# Patient Record
Sex: Female | Born: 1955 | Hispanic: No | Marital: Married | Smoking: Never smoker
Health system: Southern US, Community
[De-identification: ages and names within clinical notes are randomized; demographics above are authoritative.]

## PROBLEM LIST (undated history)

## (undated) DIAGNOSIS — F419 Anxiety disorder, unspecified: Secondary | ICD-10-CM

## (undated) DIAGNOSIS — F329 Major depressive disorder, single episode, unspecified: Secondary | ICD-10-CM

## (undated) DIAGNOSIS — Z9989 Dependence on other enabling machines and devices: Secondary | ICD-10-CM

## (undated) DIAGNOSIS — G4730 Sleep apnea, unspecified: Secondary | ICD-10-CM

## (undated) DIAGNOSIS — E079 Disorder of thyroid, unspecified: Secondary | ICD-10-CM

## (undated) DIAGNOSIS — E785 Hyperlipidemia, unspecified: Secondary | ICD-10-CM

## (undated) DIAGNOSIS — E039 Hypothyroidism, unspecified: Secondary | ICD-10-CM

## (undated) DIAGNOSIS — F32A Depression, unspecified: Secondary | ICD-10-CM

## (undated) DIAGNOSIS — I10 Essential (primary) hypertension: Secondary | ICD-10-CM

## (undated) DIAGNOSIS — K5792 Diverticulitis of intestine, part unspecified, without perforation or abscess without bleeding: Secondary | ICD-10-CM

## (undated) HISTORY — DX: Essential (primary) hypertension: I10

## (undated) HISTORY — DX: Hypothyroidism, unspecified: E03.9

## (undated) HISTORY — DX: Diverticulitis of intestine, part unspecified, without perforation or abscess without bleeding: K57.92

## (undated) HISTORY — DX: Hyperlipidemia, unspecified: E78.5

## (undated) HISTORY — DX: Anxiety disorder, unspecified: F41.9

## (undated) HISTORY — DX: Disorder of thyroid, unspecified: E07.9

## (undated) HISTORY — DX: Depression, unspecified: F32.A

## (undated) HISTORY — DX: Sleep apnea, unspecified: G47.30

## (undated) HISTORY — DX: Dependence on other enabling machines and devices: Z99.89

## (undated) HISTORY — PX: ABDOMINAL HYSTERECTOMY: SHX81

## (undated) HISTORY — PX: CHOLECYSTECTOMY: SHX55

## (undated) HISTORY — PX: BREAST EXCISIONAL BIOPSY: SUR124

## (undated) HISTORY — PX: COLONOSCOPY: SHX174

## (undated) HISTORY — DX: Major depressive disorder, single episode, unspecified: F32.9

---

## 2017-04-29 ENCOUNTER — Other Ambulatory Visit: Payer: Self-pay | Admitting: Internal Medicine

## 2017-04-29 DIAGNOSIS — Z1231 Encounter for screening mammogram for malignant neoplasm of breast: Secondary | ICD-10-CM

## 2017-04-30 ENCOUNTER — Ambulatory Visit
Admission: RE | Admit: 2017-04-30 | Discharge: 2017-04-30 | Disposition: A | Discharge status: Home / Self Care | Payer: BLUE CROSS/BLUE SHIELD | Source: Ambulatory Visit | Attending: Internal Medicine | Admitting: Internal Medicine

## 2017-04-30 DIAGNOSIS — Z1231 Encounter for screening mammogram for malignant neoplasm of breast: Secondary | ICD-10-CM

## 2018-01-04 ENCOUNTER — Telehealth: Payer: Self-pay | Admitting: Internal Medicine

## 2018-01-04 NOTE — Telephone Encounter (Signed)
Yes, I will accept her 

## 2018-01-04 NOTE — Telephone Encounter (Signed)
Copied from CRM [id number]. Topic: Appointment Scheduling - New Patient >> Jan 04, 2018  3:31 PM Baldo DaubAlexander, Amber L wrote: Pt calling.  States that she would like to personally ask if Dr. Lawerance BachBurns would see her for primary care.  Pt states her son is Dr. Marjory LiesPenumalli, neurologist [facility] and he recommended that his mother see Dr. Lawerance BachBurns. Pt can be reached at [phone]

## 2018-01-05 NOTE — Progress Notes (Signed)
Subjective:    Patient ID: Alicia Hunt, female    DOB: 27-Nov-1955, 62 y.o.   MRN: [id number]  HPI  She is here to establish with a new pcp.  She is here for a physical exam.    She has no concerns.  She knows she needs to lose weight.  She did the keto diet last and lost 15 pounds.  Over the past year she gained back and gained additional weight.  She does eat a lot of carbohydrates.  She is currently exercising regularly-does water aerobics 4 times a week.  She does have some generalized aches and pains.  She thinks part of that is getting older and thinks the weight probably contributes as well.      Medications and allergies reviewed with patient and updated if appropriate.  Patient Active Problem List   Diagnosis Date Noted  . Hypertension 01/06/2018  . Hypothyroidism 01/06/2018  . Hyperglycemia 01/06/2018  . Vitamin D deficiency 01/06/2018  . Low vitamin B12 level 01/06/2018  . Depression 01/06/2018  . Other hyperlipidemia 01/06/2018  . H/O Diverticulitis of colon with bleeding 01/06/2018    Current Outpatient Medications on File Prior to Visit  Medication Sig Dispense Refill  . amLODipine (NORVASC) 5 MG tablet Take 5 mg by mouth daily.    Marland Kitchen. atorvastatin (LIPITOR) 10 MG tablet Take 10 mg by mouth daily.    Marland Kitchen. escitalopram (LEXAPRO) 10 MG tablet Take 10 mg by mouth daily.    . Levothyroxine Sodium (SYNTHROID PO) Take by mouth.    . metoprolol tartrate (LOPRESSOR) 25 MG tablet Take 25 mg by mouth 2 (two) times daily.     No current facility-administered medications on file prior to visit.     Past Medical History:  Diagnosis Date  . Anxiety   . Depression   . Diverticulitis   . Hyperlipidemia   . Hypertension   . Thyroid disease     Past Surgical History:  Procedure Laterality Date  . ABDOMINAL HYSTERECTOMY    . BREAST EXCISIONAL BIOPSY Left 20+ years   pt states dr didnt remove lump  . CHOLECYSTECTOMY      Social History   Socioeconomic  History  . Marital status: Married    Spouse name: Not on file  . Number of children: Not on file  . Years of education: Not on file  . Highest education level: Not on file  Occupational History  . Not on file  Social Needs  . Financial resource strain: Not on file  . Food insecurity:    Worry: Not on file    Inability: Not on file  . Transportation needs:    Medical: Not on file    Non-medical: Not on file  Tobacco Use  . Smoking status: Never Smoker  . Smokeless tobacco: Never Used  Substance and Sexual Activity  . Alcohol use: Yes  . Drug use: Never  . Sexual activity: Not on file  Lifestyle  . Physical activity:    Days per week: Not on file    Minutes per session: Not on file  . Stress: Not on file  Relationships  . Social connections:    Talks on phone: Not on file    Gets together: Not on file    Attends religious service: Not on file    Active member of club or organization: Not on file    Attends meetings of clubs or organizations: Not on file    Relationship status: Not  on file  Other Topics Concern  . Not on file  Social History Narrative  . Not on file    Family History  Problem Relation Age of Onset  . Stroke Mother 960       cause of death    Review of Systems  Constitutional: Negative for chills, fatigue and fever.  Eyes: Negative for visual disturbance.  Respiratory: Positive for shortness of breath (sometimes with exertion - humidity). Negative for cough and wheezing.   Cardiovascular: Positive for leg swelling (with travel and heat). Negative for chest pain and palpitations.  Gastrointestinal: Positive for abdominal pain. Negative for blood in stool, constipation, diarrhea and nausea.       No gerd  Genitourinary: Negative for dysuria and hematuria.  Musculoskeletal: Positive for arthralgias (knees sometimes, achy joints).  Skin: Negative for color change and rash.  Neurological: Positive for numbness (numb/tingling toes) and headaches  (occasional). Negative for dizziness and light-headedness.  Psychiatric/Behavioral: Positive for dysphoric mood (controlled). The patient is not nervous/anxious.        Objective:   Vitals:   01/06/18 1010  BP: 122/80  Pulse: (!) 56  Resp: 16  Temp: 98.3 F (36.8 C)  SpO2: 97%   Filed Weights   01/06/18 1010  Weight: 212 lb (96.2 kg)   Body mass index is 36.39 kg/m.  Wt Readings from Last 3 Encounters:  01/06/18 212 lb (96.2 kg)     Physical Exam Constitutional: She appears well-developed and well-nourished. No distress.  HENT:  Head: Normocephalic and atraumatic.  Right Ear: External ear normal. Normal ear canal and TM Left Ear: External ear normal.  Normal ear canal and TM Mouth/Throat: Oropharynx is clear and moist.  Eyes: Conjunctivae and EOM are normal.  Neck: Neck supple. No tracheal deviation present. No thyromegaly present.  No carotid bruit  Cardiovascular: Normal rate, regular rhythm and normal heart sounds.   No murmur heard.  No edema. Pulmonary/Chest: Effort normal and breath sounds normal. No respiratory distress. She has no wheezes. She has no rales.  Breast: deferred to Gyn Abdominal: Soft. She exhibits no distension. There is no tenderness.  Lymphadenopathy: She has no cervical adenopathy.  Skin: Skin is warm and dry. She is not diaphoretic.  Psychiatric: She has a normal mood and affect. Her behavior is normal.        Assessment & Plan:   Physical exam: Screening blood work  ordered Immunizations  Will get records and see what is needed Colonoscopy  Up to date per patient - may be due next year - will get report Mammogram   Up to date  Gyn - no longer seeing, s/p TAH Dexa ? Had - will get records Eye exams   Had exam Q 2 months EKG  None on file - will get records Exercise  Regular x 3 months - water aerobics 4/week Weight  Discussed weight loss at length - she will work on adjusting her diet Skin    No concerns Substance abuse     none  See Problem List for Assessment and Plan of chronic medical problems.     FU in 6 months

## 2018-01-05 NOTE — Telephone Encounter (Signed)
I have tried to call patient to schedule new patient appt with Dr. Lawerance BachBurns.  Patient did not answer and did not have a VM set up.

## 2018-01-06 ENCOUNTER — Other Ambulatory Visit (INDEPENDENT_AMBULATORY_CARE_PROVIDER_SITE_OTHER): Payer: No Typology Code available for payment source

## 2018-01-06 ENCOUNTER — Ambulatory Visit (INDEPENDENT_AMBULATORY_CARE_PROVIDER_SITE_OTHER): Payer: No Typology Code available for payment source | Admitting: Internal Medicine

## 2018-01-06 ENCOUNTER — Encounter: Payer: Self-pay | Admitting: Internal Medicine

## 2018-01-06 VITALS — BP 122/80 | HR 56 | Temp 98.3°F | Resp 16 | Ht 64.0 in | Wt 212.0 lb

## 2018-01-06 DIAGNOSIS — E7849 Other hyperlipidemia: Secondary | ICD-10-CM

## 2018-01-06 DIAGNOSIS — K5733 Diverticulitis of large intestine without perforation or abscess with bleeding: Secondary | ICD-10-CM | POA: Insufficient documentation

## 2018-01-06 DIAGNOSIS — R739 Hyperglycemia, unspecified: Secondary | ICD-10-CM

## 2018-01-06 DIAGNOSIS — E538 Deficiency of other specified B group vitamins: Secondary | ICD-10-CM | POA: Insufficient documentation

## 2018-01-06 DIAGNOSIS — Z0000 Encounter for general adult medical examination without abnormal findings: Secondary | ICD-10-CM

## 2018-01-06 DIAGNOSIS — F329 Major depressive disorder, single episode, unspecified: Secondary | ICD-10-CM | POA: Insufficient documentation

## 2018-01-06 DIAGNOSIS — R202 Paresthesia of skin: Secondary | ICD-10-CM | POA: Insufficient documentation

## 2018-01-06 DIAGNOSIS — F32A Depression, unspecified: Secondary | ICD-10-CM | POA: Insufficient documentation

## 2018-01-06 DIAGNOSIS — E559 Vitamin D deficiency, unspecified: Secondary | ICD-10-CM

## 2018-01-06 DIAGNOSIS — E039 Hypothyroidism, unspecified: Secondary | ICD-10-CM | POA: Insufficient documentation

## 2018-01-06 DIAGNOSIS — I10 Essential (primary) hypertension: Secondary | ICD-10-CM | POA: Insufficient documentation

## 2018-01-06 DIAGNOSIS — F3289 Other specified depressive episodes: Secondary | ICD-10-CM

## 2018-01-06 LAB — COMPREHENSIVE METABOLIC PANEL
ALBUMIN: 4 g/dL (ref 3.5–5.2)
ALT: 33 U/L (ref 0–35)
AST: 23 U/L (ref 0–37)
Alkaline Phosphatase: 93 U/L (ref 39–117)
BUN: 14 mg/dL (ref 6–23)
CALCIUM: 9.8 mg/dL (ref 8.4–10.5)
CHLORIDE: 103 meq/L (ref 96–112)
CO2: 28 mEq/L (ref 19–32)
CREATININE: 0.86 mg/dL (ref 0.40–1.20)
GFR: 70.57 mL/min (ref >60.00)
Glucose, Bld: 109 mg/dL — ABNORMAL HIGH (ref 70–99)
POTASSIUM: 4 meq/L (ref 3.5–5.1)
Sodium: 141 mEq/L (ref 135–145)
Total Bilirubin: 1.2 mg/dL (ref 0.2–1.2)
Total Protein: 7.4 g/dL (ref 6.0–8.3)

## 2018-01-06 LAB — LIPID PANEL
CHOLESTEROL: 142 mg/dL (ref 0–200)
HDL: 40.9 mg/dL (ref >39.00)
LDL CALC: 70 mg/dL (ref 0–99)
NonHDL: 101.4
Total CHOL/HDL Ratio: 3
Triglycerides: 158 mg/dL — ABNORMAL HIGH (ref 0.0–149.0)
VLDL: 31.6 mg/dL (ref 0.0–40.0)

## 2018-01-06 LAB — CBC WITH DIFFERENTIAL/PLATELET
BASOS PCT: 0.8 % (ref 0.0–3.0)
Basophils Absolute: 0.1 10*3/uL (ref 0.0–0.1)
EOS PCT: 2.8 % (ref 0.0–5.0)
Eosinophils Absolute: 0.2 10*3/uL (ref 0.0–0.7)
HEMATOCRIT: 41.8 % (ref 36.0–46.0)
HEMOGLOBIN: 13.9 g/dL (ref 12.0–15.0)
LYMPHS PCT: 38.7 % (ref 12.0–46.0)
Lymphs Abs: 2.6 10*3/uL (ref 0.7–4.0)
MCHC: 33.4 g/dL (ref 30.0–36.0)
MCV: 82.5 fl (ref 78.0–100.0)
MONOS PCT: 7.5 % (ref 3.0–12.0)
Monocytes Absolute: 0.5 10*3/uL (ref 0.1–1.0)
Neutro Abs: 3.3 10*3/uL (ref 1.4–7.7)
Neutrophils Relative %: 50.2 % (ref 43.0–77.0)
Platelets: 223 10*3/uL (ref 150.0–400.0)
RBC: 5.06 Mil/uL (ref 3.87–5.11)
RDW: 14.1 % (ref 11.5–15.5)
WBC: 6.6 10*3/uL (ref 4.0–10.5)

## 2018-01-06 LAB — TSH: TSH: 3.86 u[IU]/mL (ref 0.35–4.50)

## 2018-01-06 LAB — HEMOGLOBIN A1C: Hgb A1c MFr Bld: 6.1 % (ref 4.6–6.5)

## 2018-01-06 LAB — VITAMIN D 25 HYDROXY (VIT D DEFICIENCY, FRACTURES): VITD: 20.54 ng/mL — AB (ref 30.00–100.00)

## 2018-01-06 LAB — VITAMIN B12: VITAMIN B 12: 460 pg/mL (ref 211–911)

## 2018-01-06 NOTE — Assessment & Plan Note (Signed)
H/o hypoglycemia a1c Work on weight loss Continue regular exercise

## 2018-01-06 NOTE — Assessment & Plan Note (Signed)
Check level 

## 2018-01-06 NOTE — Assessment & Plan Note (Signed)
Controlled, stable Continue current dose of medication-Lexapro 10 mg daily

## 2018-01-06 NOTE — Assessment & Plan Note (Signed)
Check lipid panel  Continue daily statin Regular exercise and healthy diet encouraged  

## 2018-01-06 NOTE — Assessment & Plan Note (Signed)
Just started taking a multivitamin Check B12 level

## 2018-01-06 NOTE — Assessment & Plan Note (Signed)
BP well controlled Current regimen effective and well tolerated Continue current medications at current doses cmp  

## 2018-01-06 NOTE — Patient Instructions (Addendum)
Test(s) ordered today. Your results will be released to [place] (or called to you) after review, usually within 72hours after test completion. If any changes need to be made, you will be notified at that same time.   Medications reviewed and updated.  No changes recommended at this time.    Please followup in 6 months    Health Maintenance, Female Adopting a healthy lifestyle and getting preventive care can go a long way to promote health and wellness. Talk with your health care provider about what schedule of regular examinations is right for you. This is a good chance for you to check in with your provider about disease prevention and staying healthy. In between checkups, there are plenty of things you can do on your own. Experts have done a lot of research about which lifestyle changes and preventive measures are most likely to keep you healthy. Ask your health care provider for more information. Weight and diet Eat a healthy diet  Be sure to include plenty of vegetables, fruits, low-fat dairy products, and lean protein.  Do not eat a lot of foods high in solid fats, added sugars, or salt.  Get regular exercise. This is one of the most important things you can do for your health. ? Most adults should exercise for at least 150 minutes each week. The exercise should increase your heart rate and make you sweat (moderate-intensity exercise). ? Most adults should also do strengthening exercises at least twice a week. This is in addition to the moderate-intensity exercise.  Maintain a healthy weight  Body mass index (BMI) is a measurement that can be used to identify possible weight problems. It estimates body fat based on height and weight. Your health care provider can help determine your BMI and help you achieve or maintain a healthy weight.  For females 4 years of age and older: ? A BMI below 18.5 is considered underweight. ? A BMI of 18.5 to 24.9 is normal. ? A BMI of 25 to 29.9  is considered overweight. ? A BMI of 30 and above is considered obese.  Watch levels of cholesterol and blood lipids  You should start having your blood tested for lipids and cholesterol at 64 years of age, then have this test every 5 years.  You may need to have your cholesterol levels checked more often if: ? Your lipid or cholesterol levels are high. ? You are older than 64 years of age. ? You are at high risk for heart disease.  Cancer screening Lung Cancer  Lung cancer screening is recommended for adults 71-87 years old who are at high risk for lung cancer because of a history of smoking.  A yearly low-dose CT scan of the lungs is recommended for people who: ? Currently smoke. ? Have quit within the past 15 years. ? Have at least a 30-pack-year history of smoking. A pack year is smoking an average of one pack of cigarettes a day for 1 year.  Yearly screening should continue until it has been 15 years since you quit.  Yearly screening should stop if you develop a health problem that would prevent you from having lung cancer treatment.  Breast Cancer  Practice breast self-awareness. This means understanding how your breasts normally appear and feel.  It also means doing regular breast self-exams. Let your health care provider know about any changes, no matter how small.  If you are in your 20s or 30s, you should have a clinical breast exam (CBE) by  a health care provider every 1-3 years as part of a regular health exam.  If you are 12 or older, have a CBE every year. Also consider having a breast X-ray (mammogram) every year.  If you have a family history of breast cancer, talk to your health care provider about genetic screening.  If you are at high risk for breast cancer, talk to your health care provider about having an MRI and a mammogram every year.  Breast cancer gene (BRCA) assessment is recommended for women who have family members with BRCA-related cancers.  BRCA-related cancers include: ? Breast. ? Ovarian. ? Tubal. ? Peritoneal cancers.  Results of the assessment will determine the need for genetic counseling and BRCA1 and BRCA2 testing.  Cervical Cancer Your health care provider may recommend that you be screened regularly for cancer of the pelvic organs (ovaries, uterus, and vagina). This screening involves a pelvic examination, including checking for microscopic changes to the surface of your cervix (Pap test). You may be encouraged to have this screening done every 3 years, beginning at age 48.  For women ages 16-65, health care providers may recommend pelvic exams and Pap testing every 3 years, or they may recommend the Pap and pelvic exam, combined with testing for human papilloma virus (HPV), every 5 years. Some types of HPV increase your risk of cervical cancer. Testing for HPV may also be done on women of any age with unclear Pap test results.  Other health care providers may not recommend any screening for nonpregnant women who are considered low risk for pelvic cancer and who do not have symptoms. Ask your health care provider if a screening pelvic exam is right for you.  If you have had past treatment for cervical cancer or a condition that could lead to cancer, you need Pap tests and screening for cancer for at least 20 years after your treatment. If Pap tests have been discontinued, your risk factors (such as having a new sexual partner) need to be reassessed to determine if screening should resume. Some women have medical problems that increase the chance of getting cervical cancer. In these cases, your health care provider may recommend more frequent screening and Pap tests.  Colorectal Cancer  This type of cancer can be detected and often prevented.  Routine colorectal cancer screening usually begins at 64 years of age and continues through 64 years of age.  Your health care provider may recommend screening at an earlier age if  you have risk factors for colon cancer.  Your health care provider may also recommend using home test kits to check for hidden blood in the stool.  A small camera at the end of a tube can be used to examine your colon directly (sigmoidoscopy or colonoscopy). This is done to check for the earliest forms of colorectal cancer.  Routine screening usually begins at age 16.  Direct examination of the colon should be repeated every 5-10 years through 64 years of age. However, you may need to be screened more often if early forms of precancerous polyps or small growths are found.  Skin Cancer  Check your skin from head to toe regularly.  Tell your health care provider about any new moles or changes in moles, especially if there is a change in a mole's shape or color.  Also tell your health care provider if you have a mole that is larger than the size of a pencil eraser.  Always use sunscreen. Apply sunscreen liberally and repeatedly  throughout the day.  Protect yourself by wearing long sleeves, pants, a wide-brimmed hat, and sunglasses whenever you are outside.  Heart disease, diabetes, and high blood pressure  High blood pressure causes heart disease and increases the risk of stroke. High blood pressure is more likely to develop in: ? People who have blood pressure in the high end of the normal range (130-139/85-89 mm Hg). ? People who are overweight or obese. ? People who are African American.  If you are 19-59 years of age, have your blood pressure checked every 3-5 years. If you are 30 years of age or older, have your blood pressure checked every year. You should have your blood pressure measured twice-once when you are at a hospital or clinic, and once when you are not at a hospital or clinic. Record the average of the two measurements. To check your blood pressure when you are not at a hospital or clinic, you can use: ? An automated blood pressure machine at a pharmacy. ? A home blood  pressure monitor.  If you are between 69 years and 34 years old, ask your health care provider if you should take aspirin to prevent strokes.  Have regular diabetes screenings. This involves taking a blood sample to check your fasting blood sugar level. ? If you are at a normal weight and have a low risk for diabetes, have this test once every three years after 64 years of age. ? If you are overweight and have a high risk for diabetes, consider being tested at a younger age or more often. Preventing infection Hepatitis B  If you have a higher risk for hepatitis B, you should be screened for this virus. You are considered at high risk for hepatitis B if: ? You were born in a country where hepatitis B is common. Ask your health care provider which countries are considered high risk. ? Your parents were born in a high-risk country, and you have not been immunized against hepatitis B (hepatitis B vaccine). ? You have HIV or AIDS. ? You use needles to inject street drugs. ? You live with someone who has hepatitis B. ? You have had sex with someone who has hepatitis B. ? You get hemodialysis treatment. ? You take certain medicines for conditions, including cancer, organ transplantation, and autoimmune conditions.  Hepatitis C  Blood testing is recommended for: ? Everyone born from [birth year] through 1967. ? Anyone with known risk factors for hepatitis C.  Sexually transmitted infections (STIs)  You should be screened for sexually transmitted infections (STIs) including gonorrhea and chlamydia if: ? You are sexually active and are younger than 64 years of age. ? You are older than 64 years of age and your health care provider tells you that you are at risk for this type of infection. ? Your sexual activity has changed since you were last screened and you are at an increased risk for chlamydia or gonorrhea. Ask your health care provider if you are at risk.  If you do not have HIV, but are at risk,  it may be recommended that you take a prescription medicine daily to prevent HIV infection. This is called pre-exposure prophylaxis (PrEP). You are considered at risk if: ? You are sexually active and do not regularly use condoms or know the HIV status of your partner(s). ? You take drugs by injection. ? You are sexually active with a partner who has HIV.  Talk with your health care provider about whether you are  at high risk of being infected with HIV. If you choose to begin PrEP, you should first be tested for HIV. You should then be tested every 3 months for as long as you are taking PrEP. Pregnancy  If you are premenopausal and you may become pregnant, ask your health care provider about preconception counseling.  If you may become pregnant, take 400 to 800 micrograms (mcg) of folic acid every day.  If you want to prevent pregnancy, talk to your health care provider about birth control (contraception). Osteoporosis and menopause  Osteoporosis is a disease in which the bones lose minerals and strength with aging. This can result in serious bone fractures. Your risk for osteoporosis can be identified using a bone density scan.  If you are 48 years of age or older, or if you are at risk for osteoporosis and fractures, ask your health care provider if you should be screened.  Ask your health care provider whether you should take a calcium or vitamin D supplement to lower your risk for osteoporosis.  Menopause may have certain physical symptoms and risks.  Hormone replacement therapy may reduce some of these symptoms and risks. Talk to your health care provider about whether hormone replacement therapy is right for you. Follow these instructions at home:  Schedule regular health, dental, and eye exams.  Stay current with your immunizations.  Do not use any tobacco products including cigarettes, chewing tobacco, or electronic cigarettes.  If you are pregnant, do not drink  alcohol.  If you are breastfeeding, limit how much and how often you drink alcohol.  Limit alcohol intake to no more than 1 drink per day for nonpregnant women. One drink equals 12 ounces of beer, 5 ounces of wine, or 1 ounces of hard liquor.  Do not use street drugs.  Do not share needles.  Ask your health care provider for help if you need support or information about quitting drugs.  Tell your health care provider if you often feel depressed.  Tell your health care provider if you have ever been abused or do not feel safe at home. This information is not intended to replace advice given to you by your health care provider. Make sure you discuss any questions you have with your health care provider. Document Released: 11/18/2010 Document Revised: 10/11/2015 Document Reviewed: 02/06/2015 Elsevier Interactive Patient Education  [employer].

## 2018-01-06 NOTE — Assessment & Plan Note (Signed)
Check tsh  Titrate med dose if needed  

## 2018-01-07 ENCOUNTER — Encounter: Payer: Self-pay | Admitting: Internal Medicine

## 2018-01-07 DIAGNOSIS — R7303 Prediabetes: Secondary | ICD-10-CM | POA: Insufficient documentation

## 2018-01-07 NOTE — Telephone Encounter (Signed)
Patient was scheduled and seen

## 2018-08-23 ENCOUNTER — Ambulatory Visit: Payer: Self-pay | Admitting: Internal Medicine

## 2018-09-27 ENCOUNTER — Ambulatory Visit: Payer: Self-pay | Admitting: Internal Medicine

## 2018-11-21 NOTE — Patient Instructions (Addendum)
  Tests ordered today. Your results will be released to MyChart (or called to you) after review, usually within 72hours after test completion. If any changes need to be made, you will be notified at that same time.  Medications reviewed and updated.  Changes include :   none      Please followup in 6 months   

## 2018-11-21 NOTE — Progress Notes (Signed)
Subjective:    Patient ID: Alicia Hunt, female    DOB: 02/26/1956, 63 y.o.   MRN: [id number]  HPI The patient is here for follow up.  She is exercising regularly - water exercise.   She was not as compliant with diet and exercise while she was in [place] this past fall-winter.  She does have a physician there and had a physical and her A1c was higher and she was started on metformin.  Her blood pressure medication was also changed.  She has been better with her diet in the past several months and has been exercising regularly.  Hypertension: She is taking her medication daily. She is compliant with a low sodium diet.  She denies chest pain, palpitations, edema, shortness of breath and regular headaches. She does not monitor her blood pressure at home.   Hypothyroidism:  She is taking her medication daily.  She denies any recent changes in energy or weight that are unexplained.   Hyperlipidemia: She is taking her medication daily. She is compliant with a low fat/cholesterol diet. She denies myalgias.   Prediabetes:  She was started on metformin in [place].  She did just run out of it.  She was hoping she did not need to renew it.  She is compliant with a low sugar/carbohydrate diet.  She is exercising regularly.  Depression: She is taking her medication daily as prescribed. She denies any side effects from the medication. She feels her depression is well controlled and she is happy with her current dose of medication.   Vitamin D, B12 deficiencies:  She is taking her multivitamin daily.     Medications and allergies reviewed with patient and updated if appropriate.  Patient Active Problem List   Diagnosis Date Noted  . 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 Prediabetes 01/07/2018  . Hypertension 01/06/2018  . Hypothyroidism 01/06/2018  . Vitamin D deficiency 01/06/2018  . Low vitamin B12 level 01/06/2018  . Depression 01/06/2018  . Other hyperlipidemia 01/06/2018  . H/O  Diverticulitis of colon with bleeding 01/06/2018    Current Outpatient Medications on File Prior to Visit  Medication Sig Dispense Refill  . atorvastatin (LIPITOR) 10 MG tablet Take 10 mg by mouth daily.    Marland Kitchen escitalopram (LEXAPRO) 10 MG tablet Take 10 mg by mouth daily.    . Levothyroxine Sodium (SYNTHROID PO) Take by mouth.    . olmesartan (BENICAR) 40 MG tablet Take 40 mg by mouth daily.     No current facility-administered medications on file prior to visit.     Past Medical History:  Diagnosis Date  . Anxiety   . CPAP (continuous positive airway pressure) dependence   . Depression   . Diverticulitis   . Hyperlipidemia   . Hypertension   . Sleep apnea   . Thyroid disease     Past Surgical History:  Procedure Laterality Date  . ABDOMINAL HYSTERECTOMY    . BREAST EXCISIONAL BIOPSY Left 20+ years   pt states dr didnt remove lump  . CHOLECYSTECTOMY      Social History   Socioeconomic History  . Marital status: Married    Spouse name: Not on file  . Number of children: 2  . Years of education: Not on file  . Highest education level: Not on file  Occupational History  . Not on file  Social Needs  . Financial resource strain: Not on file  . Food insecurity    Worry: Not on file    Inability: Not  on file  . Transportation needs    Medical: Not on file    Non-medical: Not on file  Tobacco Use  . Smoking status: Never Smoker  . Smokeless tobacco: Never Used  Substance and Sexual Activity  . Alcohol use: Yes  . Drug use: Never  . Sexual activity: Not on file  Lifestyle  . Physical activity    Days per week: Not on file    Minutes per session: Not on file  . Stress: Not on file  Relationships  . Social Musicianconnections    Talks on phone: Not on file    Gets together: Not on file    Attends religious service: Not on file    Active member of club or organization: Not on file    Attends meetings of clubs or organizations: Not on file    Relationship status: Not  on file  Other Topics Concern  . Not on file  Social History Narrative  . Not on file    Family History  Problem Relation Age of Onset  . Stroke Mother 1060       cause of death    Review of Systems  Constitutional: Negative for chills and fever.  Respiratory: Negative for cough, shortness of breath and wheezing.   Cardiovascular: Negative for chest pain, palpitations and leg swelling.  Neurological: Negative for light-headedness and headaches.       Objective:   Vitals:   11/22/18 1421  BP: 132/74  Pulse: 64  Resp: 16  Temp: 98.2 F (36.8 C)  SpO2: 98%   BP Readings from Last 3 Encounters:  11/22/18 132/74  01/06/18 122/80   Wt Readings from Last 3 Encounters:  11/22/18 206 lb 12.8 oz (93.8 kg)  01/06/18 212 lb (96.2 kg)   Body mass index is 35.5 kg/m.   Physical Exam    Constitutional: Appears well-developed and well-nourished. No distress.  HENT:  Head: Normocephalic and atraumatic.  Neck: Neck supple. No tracheal deviation present. No thyromegaly present.  No cervical lymphadenopathy Cardiovascular: Normal rate, regular rhythm and normal heart sounds.   No murmur heard. No carotid bruit .  No edema Pulmonary/Chest: Effort normal and breath sounds normal. No respiratory distress. No has no wheezes. No rales.  Abdomen: Soft, nontender, nondistended Skin: Skin is warm and dry. Not diaphoretic.  Psychiatric: Normal mood and affect. Behavior is normal.    Diabetic Foot Exam - Simple   Simple Foot Form Diabetic Foot exam was performed with the following findings: Yes   Visual Inspection No deformities, no ulcerations, no other skin breakdown bilaterally: Yes Sensation Testing Intact to touch and monofilament testing bilaterally: Yes Pulse Check Posterior Tibialis and Dorsalis pulse intact bilaterally: Yes Comments      Assessment & Plan:    See Problem List for Assessment and Plan of chronic medical problems.

## 2018-11-22 ENCOUNTER — Encounter: Payer: Self-pay | Admitting: Internal Medicine

## 2018-11-22 ENCOUNTER — Other Ambulatory Visit (INDEPENDENT_AMBULATORY_CARE_PROVIDER_SITE_OTHER): Payer: Medicare Other

## 2018-11-22 ENCOUNTER — Other Ambulatory Visit: Payer: Self-pay

## 2018-11-22 ENCOUNTER — Ambulatory Visit (INDEPENDENT_AMBULATORY_CARE_PROVIDER_SITE_OTHER): Payer: Medicare Other | Admitting: Internal Medicine

## 2018-11-22 VITALS — BP 132/74 | HR 64 | Temp 98.2°F | Resp 16 | Ht 64.0 in | Wt 206.8 lb

## 2018-11-22 DIAGNOSIS — F3289 Other specified depressive episodes: Secondary | ICD-10-CM

## 2018-11-22 DIAGNOSIS — R7303 Prediabetes: Secondary | ICD-10-CM | POA: Diagnosis not present

## 2018-11-22 DIAGNOSIS — E790 Hyperuricemia without signs of inflammatory arthritis and tophaceous disease: Secondary | ICD-10-CM | POA: Insufficient documentation

## 2018-11-22 DIAGNOSIS — I10 Essential (primary) hypertension: Secondary | ICD-10-CM

## 2018-11-22 DIAGNOSIS — E039 Hypothyroidism, unspecified: Secondary | ICD-10-CM

## 2018-11-22 DIAGNOSIS — E7849 Other hyperlipidemia: Secondary | ICD-10-CM

## 2018-11-22 LAB — COMPREHENSIVE METABOLIC PANEL
ALT: 27 U/L (ref 0–35)
AST: 21 U/L (ref 0–37)
Albumin: 4 g/dL (ref 3.5–5.2)
Alkaline Phosphatase: 92 U/L (ref 39–117)
BUN: 16 mg/dL (ref 6–23)
CO2: 29 mEq/L (ref 19–32)
Calcium: 9 mg/dL (ref 8.4–10.5)
Chloride: 104 mEq/L (ref 96–112)
Creatinine, Ser: 0.78 mg/dL (ref 0.40–1.20)
GFR: 74.11 mL/min (ref >60.00)
Glucose, Bld: 104 mg/dL — ABNORMAL HIGH (ref 70–99)
Potassium: 4 mEq/L (ref 3.5–5.1)
Sodium: 139 mEq/L (ref 135–145)
Total Bilirubin: 0.8 mg/dL (ref 0.2–1.2)
Total Protein: 7 g/dL (ref 6.0–8.3)

## 2018-11-22 LAB — CBC WITH DIFFERENTIAL/PLATELET
Basophils Absolute: 0 10*3/uL (ref 0.0–0.1)
Basophils Relative: 0.6 % (ref 0.0–3.0)
Eosinophils Absolute: 0.2 10*3/uL (ref 0.0–0.7)
Eosinophils Relative: 2.3 % (ref 0.0–5.0)
HCT: 41.2 % (ref 36.0–46.0)
Hemoglobin: 13.8 g/dL (ref 12.0–15.0)
Lymphocytes Relative: 31.7 % (ref 12.0–46.0)
Lymphs Abs: 2.4 10*3/uL (ref 0.7–4.0)
MCHC: 33.5 g/dL (ref 30.0–36.0)
MCV: 85.2 fl (ref 78.0–100.0)
Monocytes Absolute: 0.6 10*3/uL (ref 0.1–1.0)
Monocytes Relative: 7.3 % (ref 3.0–12.0)
Neutro Abs: 4.4 10*3/uL (ref 1.4–7.7)
Neutrophils Relative %: 58.1 % (ref 43.0–77.0)
Platelets: 205 10*3/uL (ref 150.0–400.0)
RBC: 4.83 Mil/uL (ref 3.87–5.11)
RDW: 13.9 % (ref 11.5–15.5)
WBC: 7.6 10*3/uL (ref 4.0–10.5)

## 2018-11-22 LAB — LIPID PANEL
Cholesterol: 133 mg/dL (ref 0–200)
HDL: 36.2 mg/dL — ABNORMAL LOW (ref >39.00)
NonHDL: 96.83
Total CHOL/HDL Ratio: 4
Triglycerides: 358 mg/dL — ABNORMAL HIGH (ref 0.0–149.0)
VLDL: 71.6 mg/dL — ABNORMAL HIGH (ref 0.0–40.0)

## 2018-11-22 LAB — LDL CHOLESTEROL, DIRECT: Direct LDL: 67 mg/dL

## 2018-11-22 LAB — TSH: TSH: 2.23 u[IU]/mL (ref 0.35–4.50)

## 2018-11-22 LAB — HEMOGLOBIN A1C: Hgb A1c MFr Bld: 5.9 % (ref 4.6–6.5)

## 2018-11-22 MED ORDER — MULTIVITAMINS PO CAPS: 1.0000 | ORAL_CAPSULE | Freq: Every day | ORAL | Status: AC

## 2018-11-22 NOTE — Assessment & Plan Note (Signed)
BP well controlled Current regimen effective and well tolerated Continue current medications at current doses cmp  

## 2018-11-22 NOTE — Assessment & Plan Note (Signed)
Clinically euthyroid Check tsh  Titrate med dose if needed  

## 2018-11-22 NOTE — Assessment & Plan Note (Signed)
Check lipid panel  Continue daily statin Regular exercise and healthy diet encouraged  

## 2018-11-22 NOTE — Assessment & Plan Note (Signed)
Controlled, stable Continue current dose of medication  

## 2018-11-22 NOTE — Assessment & Plan Note (Signed)
Check a1c Most likely can discontinue metformin since her her lifestyle is back to what she was doing in the past Can restart metformin in future if needed Low sugar / carb diet Stressed regular exercise

## 2019-02-17 ENCOUNTER — Other Ambulatory Visit: Payer: Self-pay | Admitting: Internal Medicine

## 2019-02-17 NOTE — Telephone Encounter (Signed)
Pt needs new rx's from dr burns. Pt needs levothyroxine 50 mcg and atorvastatin 10 mg #30 w/refills , [facility] [address]

## 2019-02-18 MED ORDER — ATORVASTATIN CALCIUM 10 MG PO TABS: 10.0000 mg | ORAL_TABLET | Freq: Every day | ORAL | 5 refills | Status: DC

## 2019-02-18 MED ORDER — LEVOTHYROXINE SODIUM 50 MCG PO TABS: 50.0000 ug | ORAL_TABLET | Freq: Every day | ORAL | 5 refills | Status: DC

## 2019-02-18 NOTE — Telephone Encounter (Signed)
Ok to fill? Does not look like you have prescribed for her yet.

## 2019-02-24 ENCOUNTER — Ambulatory Visit (INDEPENDENT_AMBULATORY_CARE_PROVIDER_SITE_OTHER): Payer: Medicare Other

## 2019-02-24 ENCOUNTER — Other Ambulatory Visit: Payer: Self-pay

## 2019-02-24 DIAGNOSIS — Z23 Encounter for immunization: Secondary | ICD-10-CM

## 2019-03-03 ENCOUNTER — Telehealth: Payer: Self-pay | Admitting: Internal Medicine

## 2019-03-03 NOTE — Telephone Encounter (Signed)
Before refill patient would like a call from nurse regarding her medications, she usually gets medication refilled in [place] and she cannot go back as of right now due to covid Call back [phone]  Rx refill escitalopram (LEXPRO) 10MG  Atorvastatin ( LIPITOR) 10MG   [place], [place] [address] [phone] (Phone) [phone] (Fax)

## 2019-03-04 MED ORDER — AMLODIPINE-OLMESARTAN 5-40 MG PO TABS: 1.0000 | ORAL_TABLET | Freq: Every day | ORAL | 1 refills | Status: DC

## 2019-03-04 MED ORDER — ESCITALOPRAM OXALATE 10 MG PO TABS: 10.0000 mg | ORAL_TABLET | Freq: Every day | ORAL | 5 refills | Status: DC

## 2019-03-04 NOTE — Addendum Note (Signed)
Addended by: Binnie Rail on: 03/04/2019 04:09 PM   Modules accepted: Orders

## 2019-03-04 NOTE — Telephone Encounter (Signed)
Patient returning call, please advise. °

## 2019-03-04 NOTE — Telephone Encounter (Signed)
Tried calling pt back in regards. No answer and the phone just kept ringing with no VM. Will try back later.

## 2019-03-04 NOTE — Telephone Encounter (Signed)
Pt states that the BP medication she gets from [place] is Olmesartan 40 mg-Amlodipine 5 mg combination. Do we have that combination here. I also told her we could send them in separately if we did not. She also said she was up for a completely different medication that was similar to that. Please advise.

## 2019-03-04 NOTE — Telephone Encounter (Signed)
Pt aware.

## 2019-03-04 NOTE — Addendum Note (Signed)
Addended by: Delice Bison E on: 03/04/2019 03:22 PM   Modules accepted: Orders

## 2019-03-04 NOTE — Telephone Encounter (Signed)
Sent to [facility]

## 2019-03-16 ENCOUNTER — Other Ambulatory Visit: Payer: Self-pay

## 2019-03-16 MED ORDER — ATORVASTATIN CALCIUM 10 MG PO TABS: 10.0000 mg | ORAL_TABLET | Freq: Every day | ORAL | 1 refills | Status: DC

## 2019-03-16 MED ORDER — LEVOTHYROXINE SODIUM 50 MCG PO TABS: 50.0000 ug | ORAL_TABLET | Freq: Every day | ORAL | 1 refills | Status: DC

## 2019-03-16 MED ORDER — AMLODIPINE-OLMESARTAN 5-40 MG PO TABS: 1.0000 | ORAL_TABLET | Freq: Every day | ORAL | 1 refills | Status: DC

## 2019-03-16 MED ORDER — ESCITALOPRAM OXALATE 10 MG PO TABS: 10.0000 mg | ORAL_TABLET | Freq: Every day | ORAL | 1 refills | Status: DC

## 2019-06-02 ENCOUNTER — Other Ambulatory Visit: Payer: Self-pay | Admitting: Internal Medicine

## 2019-06-16 ENCOUNTER — Encounter: Payer: Self-pay | Admitting: Internal Medicine

## 2019-06-16 ENCOUNTER — Other Ambulatory Visit: Payer: Self-pay

## 2019-06-16 ENCOUNTER — Ambulatory Visit (INDEPENDENT_AMBULATORY_CARE_PROVIDER_SITE_OTHER): Payer: Medicare Other | Admitting: Internal Medicine

## 2019-06-16 VITALS — BP 124/78 | HR 67 | Temp 96.6°F | Ht 64.0 in

## 2019-06-16 DIAGNOSIS — R21 Rash and other nonspecific skin eruption: Secondary | ICD-10-CM | POA: Diagnosis not present

## 2019-06-16 DIAGNOSIS — I10 Essential (primary) hypertension: Secondary | ICD-10-CM | POA: Diagnosis not present

## 2019-06-16 DIAGNOSIS — R7303 Prediabetes: Secondary | ICD-10-CM

## 2019-06-16 MED ORDER — GABAPENTIN 100 MG PO CAPS: 100.0000 mg | ORAL_CAPSULE | Freq: Three times a day (TID) | ORAL | 3 refills | Status: DC

## 2019-06-16 MED ORDER — DOXYCYCLINE HYCLATE 100 MG PO TABS: 100.0000 mg | ORAL_TABLET | Freq: Two times a day (BID) | ORAL | 0 refills | Status: DC

## 2019-06-16 MED ORDER — VALACYCLOVIR HCL 1 G PO TABS: 1000.0000 mg | ORAL_TABLET | Freq: Three times a day (TID) | ORAL | 0 refills | Status: AC

## 2019-06-16 NOTE — Assessment & Plan Note (Signed)
stable overall by history and exam, recent data reviewed with pt, and pt to continue medical treatment as before,  to f/u any worsening symptoms or concerns  

## 2019-06-16 NOTE — Assessment & Plan Note (Addendum)
Poor video connection , unable to say definitely shingles vs folliculitis, for valtrex, gabapentin asd, but also doxycycline if worsens in 2 -3 days  I spent 30 minutes preparing to see the patient by review of recent labs, imaging and procedures, obtaining and reviewing separately obtained history, communicating with the patient and family or caregiver, ordering medications, tests or procedures, and documenting clinical information in the EHR including the differential Dx, treatment, and any further evaluation and other management of rash, preDM, HTN

## 2019-06-16 NOTE — Assessment & Plan Note (Signed)
Encouraged to continue check BP at home and next visit with goal < 140/90

## 2019-06-16 NOTE — Progress Notes (Signed)
Patient ID: Alicia Hunt, female   DOB: 05-09-1956, 64 y.o.   MRN: [id number]  Virtual Visit via Video Note  I connected with Marlaine Hind on 06/16/19 at  3:40 PM EST by a video enabled telemedicine application and verified that I am speaking with the correct person using two identifiers.  Location: Patient: at home Provider: at office   I discussed the limitations of evaluation and management by telemedicine and the availability of in person appointments. The patient expressed understanding and agreed to proceed.  History of Present Illness: Here with 2 days onset left occiput rash mild to mod pain, constant, tender, multiple lumps similar she thinks to prior shingles she had around the right periocular several yrs ago; no fever and Pt denies chest pain, increased sob or doe, wheezing, orthopnea, PND, increased LE swelling, palpitations, dizziness or syncope.  Pt denies new neurological symptoms such as new headache, or facial or extremity weakness or numbness   Pt denies polydipsia, polyuria Past Medical History:  Diagnosis Date  . Anxiety   . CPAP (continuous positive airway pressure) dependence   . Depression   . Diverticulitis   . Hyperlipidemia   . Hypertension   . Sleep apnea   . Thyroid disease    Past Surgical History:  Procedure Laterality Date  . ABDOMINAL HYSTERECTOMY    . BREAST EXCISIONAL BIOPSY Left 20+ years   pt states dr didnt remove lump  . CHOLECYSTECTOMY      reports that she has never smoked. She has never used smokeless tobacco. She reports current alcohol use. She reports that she does not use drugs. family history includes Stroke (age of onset: 83) in her mother. No Known Allergies Current Outpatient Medications on File Prior to Visit  Medication Sig Dispense Refill  . amLODipine-olmesartan (AZOR) 5-40 MG tablet Take 1 tablet by mouth daily. 90 tablet 1  . atorvastatin (LIPITOR) 10 MG tablet TAKE 1 TABLET BY MOUTH  DAILY 90 tablet 0   . escitalopram (LEXAPRO) 10 MG tablet TAKE 1 TABLET BY MOUTH  DAILY 90 tablet 0  . levothyroxine (SYNTHROID) 50 MCG tablet TAKE 1 TABLET BY MOUTH  DAILY 90 tablet 0  . Multiple Vitamin (MULTIVITAMIN) capsule Take 1 capsule by mouth daily.    Marland Kitchen olmesartan (BENICAR) 40 MG tablet Take 40 mg by mouth daily.     No current facility-administered medications on file prior to visit.    Observations/Objective: Alert, NAD, appropriate mood and affect, resps normal, cn 2-12 intact, moves all 4s, left occiput rash with several small vesicles vs pustules Lab Results  Component Value Date   WBC 7.6 11/22/2018   HGB 13.8 11/22/2018   HCT 41.2 11/22/2018   PLT 205.0 11/22/2018   GLUCOSE 104 (H) 11/22/2018   CHOL 133 11/22/2018   TRIG 358.0 (H) 11/22/2018   HDL 36.20 (L) 11/22/2018   LDLDIRECT 67.0 11/22/2018   LDLCALC 70 01/06/2018   ALT 27 11/22/2018   AST 21 11/22/2018   NA 139 11/22/2018   K 4.0 11/22/2018   CL 104 11/22/2018   CREATININE 0.78 11/22/2018   BUN 16 11/22/2018   CO2 29 11/22/2018   TSH 2.23 11/22/2018   HGBA1C 5.9 11/22/2018   Assessment and Plan: See notes  Follow Up Instructions: See notes   I discussed the assessment and treatment plan with the patient. The patient was provided an opportunity to ask questions and all were answered. The patient agreed with the plan and demonstrated an understanding of the  instructions.   The patient was advised to call back or seek an in-person evaluation if the symptoms worsen or if the condition fails to improve as anticipated.   Oliver Barre, MD

## 2019-06-16 NOTE — Patient Instructions (Signed)
Please take all new medication as prescribed 

## 2019-06-17 ENCOUNTER — Ambulatory Visit: Payer: Medicare Other | Admitting: Internal Medicine

## 2019-09-12 NOTE — Patient Instructions (Addendum)
  Blood work was ordered.  An EKG was done today.   Work on increasing activity and weight loss.   Medications reviewed and updated.  Changes include :   Try gabapentin at night 100-200 mg at night for your feet burning.    A referral was ordered for GI for your colonoscopy and a mammogram was ordered.    Please followup in 6 months

## 2019-09-12 NOTE — Progress Notes (Signed)
Subjective:    Patient ID: Alicia Hunt, female    DOB: 1955-09-06, 64 y.o.   MRN: [id number]  HPI The patient is here for follow up of their chronic medical problems, including hypertension, hypothyroidism, hyperlipidemia, prediabetes, depression, vitamin d, B12 def.   She is active with her grandkids.  Her feet hurt at night - burning pain.  Sometimes they get chilled.    She has not been good with watching the sugars. She has not been exercising as much but still is active.  She has not been sleeping well.    She does have occasional chest pain, but feels it is related to GERD.  She wondered about an EKG.  She denies any chest pain with activity, but tends to happen when she eats late at night.  Medications and allergies reviewed with patient and updated if appropriate.  Patient Active Problem List   Diagnosis Date Noted  . Rash 06/16/2019  . 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 Prediabetes 01/07/2018  . Hypertension 01/06/2018  . Hypothyroidism 01/06/2018  . Vitamin D deficiency 01/06/2018  . Low vitamin B12 level 01/06/2018  . Depression 01/06/2018  . Other hyperlipidemia 01/06/2018  . H/O Diverticulitis of colon with bleeding 01/06/2018    Current Outpatient Medications on File Prior to Visit  Medication Sig Dispense Refill  . amLODipine-olmesartan (AZOR) 5-40 MG tablet Take 1 tablet by mouth daily. 90 tablet 1  . atorvastatin (LIPITOR) 10 MG tablet TAKE 1 TABLET BY MOUTH  DAILY 90 tablet 0  . escitalopram (LEXAPRO) 10 MG tablet TAKE 1 TABLET BY MOUTH  DAILY 90 tablet 0  . levothyroxine (SYNTHROID) 50 MCG tablet TAKE 1 TABLET BY MOUTH  DAILY 90 tablet 0  . Multiple Vitamin (MULTIVITAMIN) capsule Take 1 capsule by mouth daily.     No current facility-administered medications on file prior to visit.    Past Medical History:  Diagnosis Date  . Anxiety   . CPAP (continuous positive airway pressure) dependence   . Depression   . Diverticulitis   .  Hyperlipidemia   . Hypertension   . Sleep apnea   . Thyroid disease     Past Surgical History:  Procedure Laterality Date  . ABDOMINAL HYSTERECTOMY    . BREAST EXCISIONAL BIOPSY Left 20+ years   pt states dr didnt remove lump  . CHOLECYSTECTOMY      Social History   Socioeconomic History  . Marital status: Married    Spouse name: Not on file  . Number of children: 2  . Years of education: Not on file  . Highest education level: Not on file  Occupational History  . Not on file  Tobacco Use  . Smoking status: Never Smoker  . Smokeless tobacco: Never Used  Substance and Sexual Activity  . Alcohol use: Yes  . Drug use: Never  . Sexual activity: Not on file  Other Topics Concern  . Not on file  Social History Narrative  . Not on file   Social Determinants of Health   Financial Resource Strain:   . Difficulty of Paying Living Expenses:   Food Insecurity:   . Worried About Programme researcher, broadcasting/film/video in the Last Year:   . Barista in the Last Year:   Transportation Needs:   . Freight forwarder (Medical):   Marland Kitchen Lack of Transportation (Non-Medical):   Physical Activity:   . Days of Exercise per Week:   . Minutes of Exercise per Session:   Stress:   .  Feeling of Stress :   Social Connections:   . Frequency of Communication with Friends and Family:   . Frequency of Social Gatherings with Friends and Family:   . Attends Religious Services:   . Active Member of Clubs or Organizations:   . Attends Archivist Meetings:   Marland Kitchen Marital Status:     Family History  Problem Relation Age of Onset  . Stroke Mother 53       cause of death    Review of Systems  Constitutional: Negative for chills and fever.  Eyes: Negative for visual disturbance.  Respiratory: Negative for cough, shortness of breath and wheezing.   Cardiovascular: Positive for chest pain (occ- gerd related). Negative for palpitations and leg swelling.  Gastrointestinal:       Mild gerd at  times  Neurological: Negative for light-headedness, numbness and headaches.       Burning pain in feet at night       Objective:   Vitals:   09/13/19 1106  BP: (!) 144/86  Pulse: 84  Resp: 16  Temp: 98.3 F (36.8 C)  SpO2: 96%   BP Readings from Last 3 Encounters:  09/13/19 (!) 144/86  06/16/19 124/78  11/22/18 132/74   Wt Readings from Last 3 Encounters:  09/13/19 208 lb (94.3 kg)  11/22/18 206 lb 12.8 oz (93.8 kg)  01/06/18 212 lb (96.2 kg)   Body mass index is 35.7 kg/m.   Physical Exam    Constitutional: Appears well-developed and well-nourished. No distress.  HENT:  Head: Normocephalic and atraumatic.  Neck: Neck supple. No tracheal deviation present. No thyromegaly present.  No cervical lymphadenopathy Cardiovascular: Normal rate, regular rhythm and normal heart sounds.   No murmur heard. No carotid bruit .  No edema Pulmonary/Chest: Effort normal and breath sounds normal. No respiratory distress. No has no wheezes. No rales.  Skin: Skin is warm and dry. Not diaphoretic.  Psychiatric: Normal mood and affect. Behavior is normal.      Assessment & Plan:    See Problem List for Assessment and Plan of chronic medical problems.    This visit occurred during the SARS-CoV-2 public health emergency.  Safety protocols were in place, including screening questions prior to the visit, additional usage of staff PPE, and extensive cleaning of exam room while observing appropriate contact time as indicated for disinfecting solutions.

## 2019-09-13 ENCOUNTER — Encounter: Payer: Self-pay | Admitting: Internal Medicine

## 2019-09-13 ENCOUNTER — Other Ambulatory Visit: Payer: Self-pay

## 2019-09-13 ENCOUNTER — Ambulatory Visit (INDEPENDENT_AMBULATORY_CARE_PROVIDER_SITE_OTHER): Payer: Medicare Other | Admitting: Internal Medicine

## 2019-09-13 VITALS — BP 144/86 | HR 84 | Temp 98.3°F | Resp 16 | Ht 64.0 in | Wt 208.0 lb

## 2019-09-13 DIAGNOSIS — E7849 Other hyperlipidemia: Secondary | ICD-10-CM | POA: Diagnosis not present

## 2019-09-13 DIAGNOSIS — R079 Chest pain, unspecified: Secondary | ICD-10-CM

## 2019-09-13 DIAGNOSIS — E790 Hyperuricemia without signs of inflammatory arthritis and tophaceous disease: Secondary | ICD-10-CM

## 2019-09-13 DIAGNOSIS — E559 Vitamin D deficiency, unspecified: Secondary | ICD-10-CM

## 2019-09-13 DIAGNOSIS — R7303 Prediabetes: Secondary | ICD-10-CM | POA: Diagnosis not present

## 2019-09-13 DIAGNOSIS — F3289 Other specified depressive episodes: Secondary | ICD-10-CM

## 2019-09-13 DIAGNOSIS — E039 Hypothyroidism, unspecified: Secondary | ICD-10-CM | POA: Diagnosis not present

## 2019-09-13 DIAGNOSIS — R208 Other disturbances of skin sensation: Secondary | ICD-10-CM

## 2019-09-13 DIAGNOSIS — Z9989 Dependence on other enabling machines and devices: Secondary | ICD-10-CM

## 2019-09-13 DIAGNOSIS — Z1231 Encounter for screening mammogram for malignant neoplasm of breast: Secondary | ICD-10-CM

## 2019-09-13 DIAGNOSIS — E538 Deficiency of other specified B group vitamins: Secondary | ICD-10-CM | POA: Diagnosis not present

## 2019-09-13 DIAGNOSIS — Z1211 Encounter for screening for malignant neoplasm of colon: Secondary | ICD-10-CM

## 2019-09-13 DIAGNOSIS — I10 Essential (primary) hypertension: Secondary | ICD-10-CM

## 2019-09-13 DIAGNOSIS — G4733 Obstructive sleep apnea (adult) (pediatric): Secondary | ICD-10-CM

## 2019-09-13 LAB — COMPREHENSIVE METABOLIC PANEL
ALT: 29 U/L (ref 0–35)
AST: 25 U/L (ref 0–37)
Albumin: 4.1 g/dL (ref 3.5–5.2)
Alkaline Phosphatase: 99 U/L (ref 39–117)
BUN: 10 mg/dL (ref 6–23)
CO2: 31 mEq/L (ref 19–32)
Calcium: 9.3 mg/dL (ref 8.4–10.5)
Chloride: 104 mEq/L (ref 96–112)
Creatinine, Ser: 0.69 mg/dL (ref 0.40–1.20)
GFR: 85.16 mL/min (ref >60.00)
Glucose, Bld: 105 mg/dL — ABNORMAL HIGH (ref 70–99)
Potassium: 4 mEq/L (ref 3.5–5.1)
Sodium: 140 mEq/L (ref 135–145)
Total Bilirubin: 0.9 mg/dL (ref 0.2–1.2)
Total Protein: 7.2 g/dL (ref 6.0–8.3)

## 2019-09-13 LAB — CBC WITH DIFFERENTIAL/PLATELET
Basophils Absolute: 0.1 10*3/uL (ref 0.0–0.1)
Basophils Relative: 1.2 % (ref 0.0–3.0)
Eosinophils Absolute: 0.1 10*3/uL (ref 0.0–0.7)
Eosinophils Relative: 2.4 % (ref 0.0–5.0)
HCT: 42.6 % (ref 36.0–46.0)
Hemoglobin: 14.1 g/dL (ref 12.0–15.0)
Lymphocytes Relative: 36.2 % (ref 12.0–46.0)
Lymphs Abs: 2.1 10*3/uL (ref 0.7–4.0)
MCHC: 33 g/dL (ref 30.0–36.0)
MCV: 83.8 fl (ref 78.0–100.0)
Monocytes Absolute: 0.4 10*3/uL (ref 0.1–1.0)
Monocytes Relative: 6.2 % (ref 3.0–12.0)
Neutro Abs: 3.2 10*3/uL (ref 1.4–7.7)
Neutrophils Relative %: 54 % (ref 43.0–77.0)
Platelets: 232 10*3/uL (ref 150.0–400.0)
RBC: 5.09 Mil/uL (ref 3.87–5.11)
RDW: 14 % (ref 11.5–15.5)
WBC: 5.9 10*3/uL (ref 4.0–10.5)

## 2019-09-13 LAB — URIC ACID: Uric Acid, Serum: 6 mg/dL (ref 2.4–7.0)

## 2019-09-13 LAB — LIPID PANEL
Cholesterol: 134 mg/dL (ref 0–200)
HDL: 43 mg/dL (ref >39.00)
LDL Cholesterol: 61 mg/dL (ref 0–99)
NonHDL: 91.18
Total CHOL/HDL Ratio: 3
Triglycerides: 151 mg/dL — ABNORMAL HIGH (ref 0.0–149.0)
VLDL: 30.2 mg/dL (ref 0.0–40.0)

## 2019-09-13 LAB — TSH: TSH: 2.25 u[IU]/mL (ref 0.35–4.50)

## 2019-09-13 LAB — HEMOGLOBIN A1C: Hgb A1c MFr Bld: 5.9 % (ref 4.6–6.5)

## 2019-09-13 LAB — VITAMIN B12: Vitamin B-12: 554 pg/mL (ref 211–911)

## 2019-09-13 LAB — VITAMIN D 25 HYDROXY (VIT D DEFICIENCY, FRACTURES): VITD: 32.97 ng/mL (ref 30.00–100.00)

## 2019-09-13 MED ORDER — GABAPENTIN 100 MG PO CAPS: 100.0000 mg | ORAL_CAPSULE | Freq: Every day | ORAL | 3 refills | Status: DC

## 2019-09-13 NOTE — Assessment & Plan Note (Signed)
Chronic Controlled, stable Continue current dose of medication- lexapro 10 mg daily  

## 2019-09-13 NOTE — Assessment & Plan Note (Signed)
Chronic Taking MVI only Check level

## 2019-09-13 NOTE — Assessment & Plan Note (Signed)
Chronic Taking MVI Check B12 level

## 2019-09-13 NOTE — Assessment & Plan Note (Signed)
Chronic BP slightly elevated here today, but has been controlled  Current regimen well tolerated Continue current medications at current doses Work on increasing activity and weight loss cmp

## 2019-09-13 NOTE — Assessment & Plan Note (Signed)
Chronic Check lipid panel  Continue daily statin Regular exercise and healthy diet encouraged Work on weight loss

## 2019-09-13 NOTE — Assessment & Plan Note (Signed)
Acute Likely related to GERD-typically has it in the evening if she eats late.  No chest pain with activity EKG today shows sinus bradycardia 57 bpm, possible left atrial enlargement, otherwise normal EKG.  No prior EKG on file for comparison We will stop late-night eating and work on weight loss If chest pain persists she may need further evaluation, but we are both confident this is related to GERD

## 2019-09-13 NOTE — Assessment & Plan Note (Signed)
Chronic  Clinically euthyroid Check tsh  Titrate med dose if needed  

## 2019-09-13 NOTE — Assessment & Plan Note (Signed)
Acute Typically she feels this at night Concern for possible neuropathy We will check B12 level She understands the importance of good sugar control-we will be checking A1c Trial of gabapentin 100-200 mg at bedtime-can increase if needed

## 2019-09-13 NOTE — Assessment & Plan Note (Signed)
Chronic Check uric acid level

## 2019-09-13 NOTE — Assessment & Plan Note (Signed)
Chronic Uses CPAP nightly 

## 2019-09-13 NOTE — Assessment & Plan Note (Signed)
Chronic Check a1c Low sugar / carb diet Stressed regular exercise Work on weight loss  

## 2019-09-15 ENCOUNTER — Telehealth: Payer: Self-pay | Admitting: Internal Medicine

## 2019-09-15 NOTE — Telephone Encounter (Signed)
° ° °  Please return call to patient °

## 2019-09-15 NOTE — Telephone Encounter (Signed)
New Message:   Pt is calling to get lab results. Please advise.

## 2019-09-15 NOTE — Telephone Encounter (Signed)
Tried calling back. No answer and no VM set up.

## 2019-09-15 NOTE — Telephone Encounter (Signed)
Pt aware of lab results 

## 2019-09-27 ENCOUNTER — Other Ambulatory Visit: Payer: Self-pay | Admitting: Internal Medicine

## 2019-10-06 ENCOUNTER — Other Ambulatory Visit: Payer: Self-pay | Admitting: Internal Medicine

## 2019-10-07 ENCOUNTER — Other Ambulatory Visit: Payer: Self-pay | Admitting: Internal Medicine

## 2019-10-20 DIAGNOSIS — M1611 Unilateral primary osteoarthritis, right hip: Secondary | ICD-10-CM | POA: Diagnosis not present

## 2019-10-20 DIAGNOSIS — R109 Unspecified abdominal pain: Secondary | ICD-10-CM | POA: Diagnosis not present

## 2019-10-20 DIAGNOSIS — M25551 Pain in right hip: Secondary | ICD-10-CM | POA: Diagnosis not present

## 2019-10-20 DIAGNOSIS — K5900 Constipation, unspecified: Secondary | ICD-10-CM | POA: Diagnosis not present

## 2019-10-20 DIAGNOSIS — M545 Low back pain: Secondary | ICD-10-CM | POA: Diagnosis not present

## 2019-10-21 ENCOUNTER — Ambulatory Visit: Payer: Medicare Other | Admitting: Internal Medicine

## 2019-10-31 NOTE — Progress Notes (Signed)
Subjective:    Patient ID: Alicia Hunt, female    DOB: [birth year], 64 y.o.   MRN: [id number]  HPI The patient is here for an acute visit for hip and back pain.   Urgent care 6/3 for severe R lower back pain x 1 week.  It started after lifting something and twisting.  She took ultracet and that helped a little.  Heat helped.  She was prescribed prednisone taper, tylenol and tizanidine.  She completed the prednisone and took tizanidine x 2 days and her pain resolved.  A few days later it came back.  The pain is just as bad now as it was when she went to urgent care.    The pain is located on the right lateral iliac crest and extends into the r groin.  The pain is sharp in nature.  No pain down the leg.  No N/T.  No lower back pain or spine pain.  She is constipated.   That was noted on her abdominal x-ray.  She does feel that she is not having complete bowel movements at times.  She did eat some lettuce and vegetables that also caused some increased gas.    Medications and allergies reviewed with patient and updated if appropriate.  Patient Active Problem List   Diagnosis Date Noted  . Lumbar radiculopathy 11/01/2019  . Chest pain 09/13/2019  . Burning sensation of feet 09/13/2019  . OSA on CPAP 09/13/2019  . 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 Prediabetes 01/07/2018  . Hypertension 01/06/2018  . Hypothyroidism 01/06/2018  . Vitamin D deficiency 01/06/2018  . Low vitamin B12 level 01/06/2018  . Depression 01/06/2018  . Other hyperlipidemia 01/06/2018  . H/O Diverticulitis of colon with bleeding 01/06/2018    Current Outpatient Medications on File Prior to Visit  Medication Sig Dispense Refill  . amLODipine-olmesartan (AZOR) 5-40 MG tablet TAKE 1 TABLET BY MOUTH  DAILY 90 tablet 1  . atorvastatin (LIPITOR) 10 MG tablet TAKE 1 TABLET BY MOUTH  DAILY 90 tablet 3  . escitalopram (LEXAPRO) 10 MG tablet TAKE 1 TABLET BY MOUTH  DAILY 90 tablet 1  . gabapentin (NEURONTIN) 100 MG  capsule Take 1-2 capsules (100-200 mg total) by mouth at bedtime. 90 capsule 3  . levothyroxine (SYNTHROID) 50 MCG tablet TAKE 1 TABLET BY MOUTH  DAILY 90 tablet 1  . Multiple Vitamin (MULTIVITAMIN) capsule Take 1 capsule by mouth daily.    Marland Kitchen tiZANidine (ZANAFLEX) 4 MG tablet TAKE 1 4 TO 1 TABLET BY MOUTH UP TO 4 TIMES DAILY     No current facility-administered medications on file prior to visit.    Past Medical History:  Diagnosis Date  . Anxiety   . CPAP (continuous positive airway pressure) dependence   . Depression   . Diverticulitis   . Hyperlipidemia   . Hypertension   . Sleep apnea   . Thyroid disease     Past Surgical History:  Procedure Laterality Date  . ABDOMINAL HYSTERECTOMY    . BREAST EXCISIONAL BIOPSY Left 20+ years   pt states dr didnt remove lump  . CHOLECYSTECTOMY      Social History   Socioeconomic History  . Marital status: Married    Spouse name: Not on file  . Number of children: 2  . Years of education: Not on file  . Highest education level: Not on file  Occupational History  . Not on file  Tobacco Use  . Smoking status: Never Smoker  . Smokeless tobacco:  Never Used  Substance and Sexual Activity  . Alcohol use: Yes  . Drug use: Never  . Sexual activity: Not on file  Other Topics Concern  . Not on file  Social History Narrative  . Not on file   Social Determinants of Health   Financial Resource Strain:   . Difficulty of Paying Living Expenses:   Food Insecurity:   . Worried About Charity fundraiser in the Last Year:   . Arboriculturist in the Last Year:   Transportation Needs:   . Film/video editor (Medical):   Marland Kitchen Lack of Transportation (Non-Medical):   Physical Activity:   . Days of Exercise per Week:   . Minutes of Exercise per Session:   Stress:   . Feeling of Stress :   Social Connections:   . Frequency of Communication with Friends and Family:   . Frequency of Social Gatherings with Friends and Family:   . Attends  Religious Services:   . Active Member of Clubs or Organizations:   . Attends Archivist Meetings:   Marland Kitchen Marital Status:     Family History  Problem Relation Age of Onset  . Stroke Mother 45       cause of death    Review of Systems Per HPI    Objective:   Vitals:   11/01/19 1428  BP: 126/78  Pulse: 71  Temp: 98.1 F (36.7 C)  SpO2: 95%   BP Readings from Last 3 Encounters:  11/01/19 126/78  09/13/19 (!) 144/86  06/16/19 124/78   Wt Readings from Last 3 Encounters:  11/01/19 210 lb (95.3 kg)  09/13/19 208 lb (94.3 kg)  11/22/18 206 lb 12.8 oz (93.8 kg)   Body mass index is 36.05 kg/m.   Physical Exam Constitutional:      General: She is not in acute distress.    Appearance: Normal appearance. She is not ill-appearing.  HENT:     Head: Normocephalic and atraumatic.  Musculoskeletal:     Right lower leg: No edema.     Left lower leg: No edema.     Comments: No tenderness along the lumbar spine or across the lower back.  Normal flexion of the spine, increased pain with extension of spine.  Area of pain of the right iliac crest and groin nontender to palpation.    Neurological:     Mental Status: She is alert.     Sensory: No sensory deficit.     Motor: No weakness.     Comments: Increased pain with right leg raise            Assessment & Plan:    See Problem List for Assessment and Plan of chronic medical problems.    This visit occurred during the SARS-CoV-2 public health emergency.  Safety protocols were in place, including screening questions prior to the visit, additional usage of staff PPE, and extensive cleaning of exam room while observing appropriate contact time as indicated for disinfecting solutions.

## 2019-11-01 ENCOUNTER — Ambulatory Visit (INDEPENDENT_AMBULATORY_CARE_PROVIDER_SITE_OTHER): Payer: Medicare Other

## 2019-11-01 ENCOUNTER — Ambulatory Visit (INDEPENDENT_AMBULATORY_CARE_PROVIDER_SITE_OTHER): Payer: Medicare Other | Admitting: Internal Medicine

## 2019-11-01 ENCOUNTER — Encounter: Payer: Self-pay | Admitting: Internal Medicine

## 2019-11-01 ENCOUNTER — Other Ambulatory Visit: Payer: Self-pay

## 2019-11-01 VITALS — BP 126/78 | HR 71 | Temp 98.1°F | Ht 64.0 in | Wt 210.0 lb

## 2019-11-01 DIAGNOSIS — M545 Low back pain: Secondary | ICD-10-CM | POA: Diagnosis not present

## 2019-11-01 DIAGNOSIS — M5416 Radiculopathy, lumbar region: Secondary | ICD-10-CM | POA: Diagnosis not present

## 2019-11-01 MED ORDER — MELOXICAM 15 MG PO TABS: 15.0000 mg | ORAL_TABLET | Freq: Every day | ORAL | 0 refills | Status: DC

## 2019-11-01 NOTE — Patient Instructions (Addendum)
Have an xray downstairs today.   Take the gabapentin at bedtime.    Take the ultracet as needed.     Take the meloxicam 15 mg daily with food.     No bending, lifting or twisting.    Please call if there is no improvement in your symptoms.    Sciatica Rehab Ask your health care provider which exercises are safe for you. Do exercises exactly as told by your health care provider and adjust them as directed. It is normal to feel mild stretching, pulling, tightness, or discomfort as you do these exercises. Stop right away if you feel sudden pain or your pain gets worse. Do not begin these exercises until told by your health care provider. Stretching and range-of-motion exercises These exercises warm up your muscles and joints and improve the movement and flexibility of your hips and back. These exercises also help to relieve pain, numbness, and tingling. Sciatic nerve glide 1. Sit in a chair with your head facing down toward your chest. Place your hands behind your back. Let your shoulders slump forward. 2. Slowly straighten one of your legs while you tilt your head back as if you are looking toward the ceiling. Only straighten your leg as far as you can without making your symptoms worse. 3. Hold this position for __________ seconds. 4. Slowly return to the starting position. 5. Repeat with your other leg. Repeat __________ times. Complete this exercise __________ times a day. Knee to chest with hip adduction and internal rotation  1. Lie on your back on a firm surface with both legs straight. 2. Bend one of your knees and move it up toward your chest until you feel a gentle stretch in your lower back and buttock. Then, move your knee toward the shoulder that is on the opposite side from your leg. This is hip adduction and internal rotation. ? Hold your leg in this position by holding on to the front of your knee. 3. Hold this position for __________ seconds. 4. Slowly return to the  starting position. 5. Repeat with your other leg. Repeat __________ times. Complete this exercise __________ times a day. Prone extension on elbows  1. Lie on your abdomen on a firm surface. A bed may be too soft for this exercise. 2. Prop yourself up on your elbows. 3. Use your arms to help lift your chest up until you feel a gentle stretch in your abdomen and your lower back. ? This will place some of your body weight on your elbows. If this is uncomfortable, try stacking pillows under your chest. ? Your hips should stay down, against the surface that you are lying on. Keep your hip and back muscles relaxed. 4. Hold this position for __________ seconds. 5. Slowly relax your upper body and return to the starting position. Repeat __________ times. Complete this exercise __________ times a day. Strengthening exercises These exercises build strength and endurance in your back. Endurance is the ability to use your muscles for a long time, even after they get tired. Pelvic tilt This exercise strengthens the muscles that lie deep in the abdomen. 1. Lie on your back on a firm surface. Bend your knees and keep your feet flat on the floor. 2. Tense your abdominal muscles. Tip your pelvis up toward the ceiling and flatten your lower back into the floor. ? To help with this exercise, you may place a small towel under your lower back and try to push your back into the towel.  3. Hold this position for __________ seconds. 4. Let your muscles relax completely before you repeat this exercise. Repeat __________ times. Complete this exercise __________ times a day. Alternating arm and leg raises  1. Get on your hands and knees on a firm surface. If you are on a hard floor, you may want to use padding, such as an exercise mat, to cushion your knees. 2. Line up your arms and legs. Your hands should be directly below your shoulders, and your knees should be directly below your hips. 3. Lift your left leg  behind you. At the same time, raise your right arm and straighten it in front of you. ? Do not lift your leg higher than your hip. ? Do not lift your arm higher than your shoulder. ? Keep your abdominal and back muscles tight. ? Keep your hips facing the ground. ? Do not arch your back. ? Keep your balance carefully, and do not hold your breath. 4. Hold this position for __________ seconds. 5. Slowly return to the starting position. 6. Repeat with your right leg and your left arm. Repeat __________ times. Complete this exercise __________ times a day. Posture and body mechanics Good posture and healthy body mechanics can help to relieve stress in your body's tissues and joints. Body mechanics refers to the movements and positions of your body while you do your daily activities. Posture is part of body mechanics. Good posture means:  Your spine is in its natural S-curve position (neutral).  Your shoulders are pulled back slightly.  Your head is not tipped forward. Follow these guidelines to improve your posture and body mechanics in your everyday activities. Standing   When standing, keep your spine neutral and your feet about hip width apart. Keep a slight bend in your knees. Your ears, shoulders, and hips should line up.  When you do a task in which you stand in one place for a long time, place one foot up on a stable object that is 2-4 inches (5-10 cm) high, such as a footstool. This helps keep your spine neutral. Sitting   When sitting, keep your spine neutral and keep your feet flat on the floor. Use a footrest, if necessary, and keep your thighs parallel to the floor. Avoid rounding your shoulders, and avoid tilting your head forward.  When working at a desk or a computer, keep your desk at a height where your hands are slightly lower than your elbows. Slide your chair under your desk so you are close enough to maintain good posture.  When working at a computer, place your  monitor at a height where you are looking straight ahead and you do not have to tilt your head forward or downward to look at the screen. Resting  When lying down and resting, avoid positions that are most painful for you.  If you have pain with activities such as sitting, bending, stooping, or squatting, lie in a position in which your body does not bend very much. For example, avoid curling up on your side with your arms and knees near your chest (fetal position).  If you have pain with activities such as standing for a long time or reaching with your arms, lie with your spine in a neutral position and bend your knees slightly. Try the following positions: ? Lying on your side with a pillow between your knees. ? Lying on your back with a pillow under your knees. Lifting   When lifting objects, keep your feet  at least shoulder width apart and tighten your abdominal muscles.  Bend your knees and hips and keep your spine neutral. It is important to lift using the strength of your legs, not your back. Do not lock your knees straight out.  Always ask for help to lift heavy or awkward objects. This information is not intended to replace advice given to you by your health care provider. Make sure you discuss any questions you have with your health care provider. Document Revised: 08/27/2018 Document Reviewed: 05/27/2018 Elsevier Patient Education  [employer].

## 2019-11-01 NOTE — Assessment & Plan Note (Signed)
Acute Symptoms consistent with lumbar radiculopathy of L1-2 Lumbar x-ray today Start gabapentin 100-200 mg at bedtime Start meloxicam 15 mg daily with food Can take tizanidine as needed if she has muscle spasms, but currently denies Has Ultracet at home and she can take that as needed for moderate-severe pain, but she will try to avoid it Can heat, ice Back exercises given Deferred referral to sports medicine at this time, but if no improvement she will let me know so I can refer her

## 2019-11-04 ENCOUNTER — Telehealth: Payer: Self-pay | Admitting: Internal Medicine

## 2019-11-04 DIAGNOSIS — M545 Low back pain, unspecified: Secondary | ICD-10-CM

## 2019-11-04 NOTE — Telephone Encounter (Signed)
    Patient calling to report back pain, states gabapentin (NEURONTIN) 100 MG capsule not working

## 2019-11-04 NOTE — Telephone Encounter (Signed)
Based on Dr. Lawerance Bach note from their OV, it looks like the plan was to refer to sports medicine if the pain persists. I will update that referral. Or Albin Felling, can you see if [facility] can reach out to schedule?

## 2019-11-04 NOTE — Telephone Encounter (Signed)
Left message for patient to call back to schedule.  °

## 2019-11-08 ENCOUNTER — Encounter: Payer: Self-pay | Admitting: Family Medicine

## 2019-11-08 ENCOUNTER — Ambulatory Visit: Payer: Medicare Other | Admitting: Family Medicine

## 2019-11-08 ENCOUNTER — Other Ambulatory Visit: Payer: Self-pay

## 2019-11-08 VITALS — BP 132/78 | HR 66 | Ht 64.0 in | Wt 210.0 lb

## 2019-11-08 DIAGNOSIS — R1031 Right lower quadrant pain: Secondary | ICD-10-CM

## 2019-11-08 DIAGNOSIS — S39012A Strain of muscle, fascia and tendon of lower back, initial encounter: Secondary | ICD-10-CM | POA: Diagnosis not present

## 2019-11-08 NOTE — Progress Notes (Signed)
Subjective:    CC: Low back and R iliac crest/groin pain  I, Molly Weber, LAT, ATC, am serving as scribe for Dr. Clementeen Graham.  HPI: Pt is a 64 y/o female presenting w/ c/o low back pain x 2 weeks and R lateral iliac crest/groin pain.  Low back pain: Began about a month ago after lifting something and twisting at the same time.  She was seen at urgent care and prescribed prednisone taper, tylenol and tizanidine which resolved her pain initially.  However, the pain came back a few days later after finishing the medication. -Radiating pain: yes into R groin -LE numbness/tingling: No -Aggravating factors: laying on her side at night; prolonged sitting or standing -Treatments tried: prednisone taper, tizanidine, Tylenol; Ultracet; pool  -Diagnostic testing: L-spine XR- 11/01/19  Pt also reports right lower quadrant abdominal pain.  She does not think this is very much connected to her back.  She notes she will sometimes have pain before bowel movement.  She notes she has been told that she is a bit constipated with her recent x-ray.  She has been taking stool softeners but has not tried MiraLAX yet.  She denies any pain into her hip or thigh.  Pertinent review of Systems: No fevers or chills  Relevant historical information: Hypertension, hypothyroidism   Objective:    Vitals:   11/08/19 1431  BP: 132/78  Pulse: 66  SpO2: 96%   General: Well Developed, well nourished, and in no acute distress.   MSK:  L-spine: Normal-appearing Nontender midline. Mildly tender palpation right lumbar paraspinal musculature. Normal lumbar motion. Lower extremity strength reflexes and sensation are intact distally.  Abdomen normal-appearing. Mildly tender palpation right lower quadrant without rebound or guarding.    Right hip normal.  Normal motion nontender normal strength  Lab and Radiology Results DG Lumbar Spine Complete  Result Date: 11/02/2019 CLINICAL DATA:  Right-sided lumbar  radiculopathy, low back pain for 3 weeks after lifting an object. Initial encounter. EXAM: LUMBAR SPINE - COMPLETE 4+ VIEW COMPARISON:  None. FINDINGS: Alignment is anatomic. Vertebral body height is maintained. Slight loss of disc space height at L4-5. Loss of disc space height and endplate degenerative changes at L5-S1. Facet sclerosis/hypertrophy from L3-4 to L5-S1. Endplate degenerative changes are incidentally imaged in the lower thoracic spine. No definite pars defects. IMPRESSION: Degenerative disc disease and facet sclerosis/hypertrophy in the lower lumbar spine. Electronically Signed   By: Leanna Battles M.D.   On: 11/02/2019 08:43   I, Clementeen Graham, personally (independently) visualized and performed the interpretation of the images attached in this note.    Impression and Recommendations:    Assessment and Plan: 64 y.o. female with right low back pain: Lumbosacral strain after lifting an object.  Failing early conservative management with medication and rest.  This point reasonable to proceed with physical therapy.  Recommend heating pad and TENS unit.  Recommend stopping muscle relaxers and occasional tramadol as it may be constipating.  Abdominal pain: Right groin pain is more right lower quadrant abdominal pain.  Doubtful true groin pain or hip etiology based on exam.  Based on her stool burden seen on lumbar spine x-ray reasonable to presume pain is from stool burden/constipation.  Plan for MiraLAX.  If not improved recommend follow-up with PCP Dr. Lawerance Bach.  She expresses understanding and agreement.  No red flag signs or symptoms with abdomen exam or history today.   Orders Placed This Encounter  Procedures   Ambulatory referral to Physical Therapy  Referral Priority:   Routine    Referral Type:   Physical Medicine    Referral Reason:   Specialty Services Required    Requested Specialty:   Physical Therapy   No orders of the defined types were placed in this  encounter.   Discussed warning signs or symptoms. Please see discharge instructions. Patient expresses understanding.   The above documentation has been reviewed and is accurate and complete Lynne Leader, M.D.

## 2019-11-08 NOTE — Patient Instructions (Signed)
Thank you for coming in today. Plan for PT for the back pain.  Also try heating pad and TENS unit.  Recheck in about 6 weeks especially if not improved.  OK to contact me or return sooner if not doing well.   For the abdominal pain.  I think constipations may be the cause.  Try mirilax over the counter typically about 1 cap per day.  If not improving quickly recommend that you schedule with Dr Quay Burow.    Lumbosacral Strain Lumbosacral strain is an injury that causes pain in the lower back (lumbosacral spine). This injury usually happens from overstretching the muscles or ligaments along your spine. Ligaments are cord-like tissues that connect bones to other bones. A strain can affect one or more muscles or ligaments. What are the causes? This condition may be caused by:  A hard, direct hit to the back.  Overstretching the lower back muscles. This may result from: ? A fall. ? Lifting something heavy. ? Repetitive movements such as bending or crouching. What increases the risk? The following factors may make you more likely to develop this condition:  Participating in sports or activities that involve: ? A sudden twist of the back. ? Pushing or pulling motions.  Being overweight or obese.  Having poor strength and flexibility, especially tight hamstrings or weak muscles in the back or abdomen.  Having too much of a curve in the lower back.  Having a pelvis that is tilted forward. What are the signs or symptoms? The main symptom of this condition is pain in the lower back, at the site of the strain. Pain may also be felt down one or both legs. How is this diagnosed? This condition is diagnosed based on your symptoms, your medical history, and a physical exam. During the physical exam, your health care provider may push on certain areas of your back to find the source of your pain. You may be asked to bend forward, backward, and side to side to check your pain and range of  motion. You may also have imaging tests, such as X-rays and an MRI. How is this treated? This condition may be treated by:  Applying heat and cold on the affected area.  Taking medicines to help relieve pain and relax your muscles.  Taking NSAIDs, such as ibuprofen, to help reduce swelling and discomfort.  Doing stretching and strengthening exercises for your lower back. Symptoms usually improve within several weeks of treatment. However, recovery time varies. When your symptoms improve, gradually return to your normal routine as soon as possible to reduce pain, avoid stiffness, and keep muscle strength. Follow these instructions at home: Medicines  Take over-the-counter and prescription medicines only as told by your health care provider.  Ask your health care provider if the medicine prescribed to you: ? Requires you to avoid driving or using heavy machinery. ? Can cause constipation. You may need to take these actions to prevent or treat constipation:  Drink enough fluid to keep your urine pale yellow.  Take over-the-counter or prescription medicines.  Eat foods that are high in fiber, such as beans, whole grains, and fresh fruits and vegetables.  Limit foods that are high in fat and processed sugars, such as fried or sweet foods. Managing pain, stiffness, and swelling      If directed, put ice on the injured area. To do this: ? Put ice in a plastic bag. ? Place a towel between your skin and the bag. ? Leave the ice  on for 20 minutes, 2-3 times a day.  If directed, apply heat on the affected area as often as told by your health care provider. Use the heat source that your health care provider recommends, such as a moist heat pack or a heating pad. ? Place a towel between your skin and the heat source. ? Leave the heat on for 20-30 minutes. ? Remove the heat if your skin turns bright red. This is especially important if you are unable to feel pain, heat, or cold. You may  have a greater risk of getting burned. Activity  Rest as told by your health care provider.  Do not stay in bed. Staying in bed for more than 1-2 days can delay your recovery.  Return to your normal activities as told by your health care provider. Ask your health care provider what activities are safe for you.  Avoid activities that take a lot of energy for as long as told by your health care provider.  Do exercises as told by your health care provider. This includes stretching and strengthening exercises. General instructions  Sit up and stand up straight. Avoid leaning forward when you sit, or hunching over when you stand.  Do not use any products that contain nicotine or tobacco, such as cigarettes, e-cigarettes, and chewing tobacco. If you need help quitting, ask your health care provider.  Keep all follow-up visits as told by your health care provider. This is important. How is this prevented?   Use correct form when playing sports and lifting heavy objects.  Use good posture when sitting and standing.  Maintain a healthy weight.  Sleep on a mattress with medium firmness to support your back.  Do at least 150 minutes of moderate-intensity exercise each week, such as brisk walking or water aerobics. Try a form of exercise that takes stress off your back, such as swimming or stationary cycling.  Maintain physical fitness, including: ? Strength. ? Flexibility. Contact a health care provider if:  Your back pain does not improve after several weeks of treatment.  Your symptoms get worse. Get help right away if:  Your back pain is severe.  You cannot stand or walk.  You have difficulty controlling when you urinate or when you have a bowel movement.  You feel nauseous or you vomit.  Your feet or legs get very cold, turn pale, or look blue.  You have numbness, tingling, weakness, or problems using your arms or legs.  You develop any of the following: ? Shortness of  breath. ? Dizziness. ? Pain in your legs. ? Weakness in your buttocks or legs. Summary  Lumbosacral strain is an injury that causes pain in the lower back (lumbosacral spine).  This injury usually happens from overstretching the muscles or ligaments along your spine.  This condition may be caused by a direct hit to the lower back or by overstretching the lower back muscles.  Symptoms usually improve within several weeks of treatment. This information is not intended to replace advice given to you by your health care provider. Make sure you discuss any questions you have with your health care provider. Document Revised: 09/28/2018 Document Reviewed: 09/28/2018 Elsevier Patient Education  2020 [employer].

## 2019-11-14 ENCOUNTER — Telehealth: Payer: Self-pay

## 2019-11-14 NOTE — Progress Notes (Signed)
Subjective:    Patient ID: Alicia Hunt, female    DOB: 03-15-1956, 64 y.o.   MRN: [id number]  HPI The patient is here for an acute visit for back pain.  She did see sports med last week. She was referred to PT.  She has been going to the pool and doing stretches.  Pain was better until just recently.   Three nights ago she started with pain again. It was severe.  She tried tizanidine, meloxicam and there was no relief.  The pain was severe in her right lower abdomen/groin.  She took ultracet and that helped.   No pain right leg.  No N/T or weakness.     Constipation - she is taking a stool softener and miralax and has been going to the bathroom.  She still feels constipated or bloated.    Medications and allergies reviewed with patient and updated if appropriate.  Patient Active Problem List   Diagnosis Date Noted  . Constipation 11/15/2019  . Lumbar radiculopathy 11/01/2019  . Chest pain 09/13/2019  . Burning sensation of feet 09/13/2019  . OSA on CPAP 09/13/2019  . 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 Prediabetes 01/07/2018  . Hypertension 01/06/2018  . Hypothyroidism 01/06/2018  . Vitamin D deficiency 01/06/2018  . Low vitamin B12 level 01/06/2018  . Depression 01/06/2018  . Other hyperlipidemia 01/06/2018  . H/O Diverticulitis of colon with bleeding 01/06/2018    Current Outpatient Medications on File Prior to Visit  Medication Sig Dispense Refill  . amLODipine-olmesartan (AZOR) 5-40 MG tablet TAKE 1 TABLET BY MOUTH  DAILY 90 tablet 1  . atorvastatin (LIPITOR) 10 MG tablet TAKE 1 TABLET BY MOUTH  DAILY 90 tablet 3  . escitalopram (LEXAPRO) 10 MG tablet TAKE 1 TABLET BY MOUTH  DAILY 90 tablet 1  . levothyroxine (SYNTHROID) 50 MCG tablet TAKE 1 TABLET BY MOUTH  DAILY 90 tablet 1  . Multiple Vitamin (MULTIVITAMIN) capsule Take 1 capsule by mouth daily.    . traMADol-acetaminophen (ULTRACET) 37.5-325 MG tablet Take 1 tablet by mouth every 6 (six) hours as needed.      No current facility-administered medications on file prior to visit.    Past Medical History:  Diagnosis Date  . Anxiety   . CPAP (continuous positive airway pressure) dependence   . Depression   . Diverticulitis   . Hyperlipidemia   . Hypertension   . Sleep apnea   . Thyroid disease     Past Surgical History:  Procedure Laterality Date  . ABDOMINAL HYSTERECTOMY    . BREAST EXCISIONAL BIOPSY Left 20+ years   pt states dr didnt remove lump  . CHOLECYSTECTOMY      Social History   Socioeconomic History  . Marital status: Married    Spouse name: Not on file  . Number of children: 2  . Years of education: Not on file  . Highest education level: Not on file  Occupational History  . Not on file  Tobacco Use  . Smoking status: Never Smoker  . Smokeless tobacco: Never Used  Substance and Sexual Activity  . Alcohol use: Yes  . Drug use: Never  . Sexual activity: Not on file  Other Topics Concern  . Not on file  Social History Narrative  . Not on file   Social Determinants of Health   Financial Resource Strain:   . Difficulty of Paying Living Expenses:   Food Insecurity:   . Worried About Charity fundraiser in the Last  Year:   . Ran Out of Food in the Last Year:   Transportation Needs:   . Freight forwarder (Medical):   Marland Kitchen Lack of Transportation (Non-Medical):   Physical Activity:   . Days of Exercise per Week:   . Minutes of Exercise per Session:   Stress:   . Feeling of Stress :   Social Connections:   . Frequency of Communication with Friends and Family:   . Frequency of Social Gatherings with Friends and Family:   . Attends Religious Services:   . Active Member of Clubs or Organizations:   . Attends Banker Meetings:   Marland Kitchen Marital Status:     Family History  Problem Relation Age of Onset  . Stroke Mother 77       cause of death    Review of Systems  Constitutional: Positive for appetite change (decreased). Negative for fever.    Gastrointestinal: Positive for abdominal distention, abdominal pain (RLQ) and constipation. Negative for blood in stool.  Genitourinary: Negative for dysuria.  Musculoskeletal: Negative for back pain.  Neurological: Negative for weakness and numbness.       Objective:   Vitals:   11/15/19 0816  BP: 140/84  Pulse: 80  Temp: 98.2 F (36.8 C)  SpO2: 98%   BP Readings from Last 3 Encounters:  11/15/19 140/84  11/08/19 132/78  11/01/19 126/78   Wt Readings from Last 3 Encounters:  11/15/19 210 lb (95.3 kg)  11/08/19 210 lb (95.3 kg)  11/01/19 210 lb (95.3 kg)   Body mass index is 36.05 kg/m.   Physical Exam Constitutional:      General: She is not in acute distress.    Appearance: She is well-developed. She is not ill-appearing.  HENT:     Head: Normocephalic and atraumatic.  Abdominal:     General: Abdomen is protuberant.     Palpations: Abdomen is soft. There is no mass.     Tenderness: There is abdominal tenderness in the right lower quadrant. There is no right CVA tenderness, guarding or rebound.     Hernia: No hernia is present.  Skin:    General: Skin is warm and dry.     Findings: No rash.  Neurological:     General: No focal deficit present.     Mental Status: She is alert.            Assessment & Plan:    See Problem List for Assessment and Plan of chronic medical problems.    This visit occurred during the SARS-CoV-2 public health emergency.  Safety protocols were in place, including screening questions prior to the visit, additional usage of staff PPE, and extensive cleaning of exam room while observing appropriate contact time as indicated for disinfecting solutions.

## 2019-11-14 NOTE — Telephone Encounter (Signed)
Per [facility] on 11/13/2019 1:39:16 PM, Caller states she is having excruciating pain on right side of hip. Started at midnight.  Has seen MD for this before was told to go to sports medicine. Took muscle relaxer and meloxicam with no relief. Rates pain 10/10.  Advised to see PCP within 4 hours.  Was referred to [facility].

## 2019-11-15 ENCOUNTER — Other Ambulatory Visit: Payer: Self-pay

## 2019-11-15 ENCOUNTER — Ambulatory Visit: Payer: Medicare Other | Admitting: Physical Therapy

## 2019-11-15 ENCOUNTER — Encounter: Payer: Self-pay | Admitting: Internal Medicine

## 2019-11-15 ENCOUNTER — Ambulatory Visit (INDEPENDENT_AMBULATORY_CARE_PROVIDER_SITE_OTHER): Payer: Medicare Other | Admitting: Internal Medicine

## 2019-11-15 ENCOUNTER — Telehealth: Payer: Self-pay | Admitting: Internal Medicine

## 2019-11-15 ENCOUNTER — Telehealth: Payer: Self-pay

## 2019-11-15 ENCOUNTER — Ambulatory Visit
Admission: RE | Admit: 2019-11-15 | Discharge: 2019-11-15 | Disposition: A | Discharge status: Home / Self Care | Payer: Medicare Other | Source: Ambulatory Visit | Attending: Internal Medicine | Admitting: Internal Medicine

## 2019-11-15 VITALS — BP 140/84 | HR 80 | Temp 98.2°F | Ht 64.0 in | Wt 210.0 lb

## 2019-11-15 DIAGNOSIS — K5903 Drug induced constipation: Secondary | ICD-10-CM | POA: Diagnosis not present

## 2019-11-15 DIAGNOSIS — M5416 Radiculopathy, lumbar region: Secondary | ICD-10-CM

## 2019-11-15 DIAGNOSIS — R1031 Right lower quadrant pain: Secondary | ICD-10-CM

## 2019-11-15 DIAGNOSIS — R109 Unspecified abdominal pain: Secondary | ICD-10-CM | POA: Diagnosis not present

## 2019-11-15 DIAGNOSIS — K5900 Constipation, unspecified: Secondary | ICD-10-CM | POA: Insufficient documentation

## 2019-11-15 LAB — CBC WITH DIFFERENTIAL/PLATELET
Basophils Absolute: 0.1 10*3/uL (ref 0.0–0.1)
Basophils Relative: 1 % (ref 0.0–3.0)
Eosinophils Absolute: 0.2 10*3/uL (ref 0.0–0.7)
Eosinophils Relative: 3.4 % (ref 0.0–5.0)
HCT: 40.5 % (ref 36.0–46.0)
Hemoglobin: 13.8 g/dL (ref 12.0–15.0)
Lymphocytes Relative: 38.2 % (ref 12.0–46.0)
Lymphs Abs: 2.7 10*3/uL (ref 0.7–4.0)
MCHC: 34 g/dL (ref 30.0–36.0)
MCV: 82.5 fl (ref 78.0–100.0)
Monocytes Absolute: 0.5 10*3/uL (ref 0.1–1.0)
Monocytes Relative: 7.6 % (ref 3.0–12.0)
Neutro Abs: 3.5 10*3/uL (ref 1.4–7.7)
Neutrophils Relative %: 49.8 % (ref 43.0–77.0)
Platelets: 221 10*3/uL (ref 150.0–400.0)
RBC: 4.91 Mil/uL (ref 3.87–5.11)
RDW: 14 % (ref 11.5–15.5)
WBC: 7 10*3/uL (ref 4.0–10.5)

## 2019-11-15 LAB — COMPREHENSIVE METABOLIC PANEL
ALT: 28 U/L (ref 0–35)
AST: 23 U/L (ref 0–37)
Albumin: 4 g/dL (ref 3.5–5.2)
Alkaline Phosphatase: 92 U/L (ref 39–117)
BUN: 17 mg/dL (ref 6–23)
CO2: 30 mEq/L (ref 19–32)
Calcium: 9.2 mg/dL (ref 8.4–10.5)
Chloride: 101 mEq/L (ref 96–112)
Creatinine, Ser: 0.86 mg/dL (ref 0.40–1.20)
GFR: 66.01 mL/min (ref >60.00)
Glucose, Bld: 112 mg/dL — ABNORMAL HIGH (ref 70–99)
Potassium: 3.3 mEq/L — ABNORMAL LOW (ref 3.5–5.1)
Sodium: 139 mEq/L (ref 135–145)
Total Bilirubin: 1 mg/dL (ref 0.2–1.2)
Total Protein: 6.9 g/dL (ref 6.0–8.3)

## 2019-11-15 MED ORDER — IOPAMIDOL (ISOVUE-300) INJECTION 61%
100.0000 mL | Freq: Once | INTRAVENOUS | Status: AC | PRN
  Administered 2019-11-15: 100 mL via INTRAVENOUS

## 2019-11-15 NOTE — Assessment & Plan Note (Signed)
?   Pain related to lumbar radiculopathy - L1 or abdominal in nature ultracet is helping - continue No relief with other medications Referred to PT by sports medicine

## 2019-11-15 NOTE — Telephone Encounter (Signed)
Spoke with patient and results given. 

## 2019-11-15 NOTE — Patient Instructions (Signed)
  Blood work was ordered.     A Ct scan your abdomen was ordered.     Medications reviewed and updated.  Changes include :   None - continue ultracet for your pain

## 2019-11-15 NOTE — Telephone Encounter (Signed)
CT of abdm/pelvis shows no evidence of a bowel obstruction, normal appendix.

## 2019-11-15 NOTE — Assessment & Plan Note (Signed)
Acute Pain in RLQ and just above right iliac crest - ? Radiculopathy vs abdominal Also with constipation - fairly controlled with stool softener and miralax Will get CBC, cmp and Ct abd/pelvis to r/o GI pathology, diverticulitis appendicitis ultracet prn Continue bowel regimen

## 2019-11-15 NOTE — Assessment & Plan Note (Signed)
Acute Drug induced Continue stool softener and mira lax Continue exercise as tolerated and increased water intake

## 2019-11-15 NOTE — Telephone Encounter (Signed)
Ct scan of abdomen is normal - no colon inflammation, appendicitis and not mention of constipation or excessive stool.    Blood work is normal except minimally low potassium which is minimal and we will just recheck with her next blood work.    This makes the pain look like it is coming from the back.  She should continue the ultracet and start physical therapy to see if that helps.

## 2019-11-24 ENCOUNTER — Encounter: Payer: Self-pay | Admitting: Internal Medicine

## 2020-01-01 DIAGNOSIS — M542 Cervicalgia: Secondary | ICD-10-CM | POA: Diagnosis not present

## 2020-01-01 DIAGNOSIS — R030 Elevated blood-pressure reading, without diagnosis of hypertension: Secondary | ICD-10-CM | POA: Diagnosis not present

## 2020-01-01 DIAGNOSIS — M25511 Pain in right shoulder: Secondary | ICD-10-CM | POA: Diagnosis not present

## 2020-01-01 DIAGNOSIS — M79601 Pain in right arm: Secondary | ICD-10-CM | POA: Diagnosis not present

## 2020-01-01 DIAGNOSIS — I10 Essential (primary) hypertension: Secondary | ICD-10-CM | POA: Diagnosis not present

## 2020-01-01 DIAGNOSIS — R52 Pain, unspecified: Secondary | ICD-10-CM | POA: Diagnosis not present

## 2020-01-01 DIAGNOSIS — R404 Transient alteration of awareness: Secondary | ICD-10-CM | POA: Diagnosis not present
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
## 2020-01-05 ENCOUNTER — Ambulatory Visit: Payer: Medicare Other | Admitting: Family Medicine

## 2020-01-05 ENCOUNTER — Ambulatory Visit (INDEPENDENT_AMBULATORY_CARE_PROVIDER_SITE_OTHER): Payer: Medicare Other

## 2020-01-05 ENCOUNTER — Encounter: Payer: Self-pay | Admitting: Family Medicine

## 2020-01-05 ENCOUNTER — Other Ambulatory Visit: Payer: Self-pay

## 2020-01-05 ENCOUNTER — Encounter: Payer: Self-pay | Admitting: Internal Medicine

## 2020-01-05 ENCOUNTER — Ambulatory Visit (INDEPENDENT_AMBULATORY_CARE_PROVIDER_SITE_OTHER): Payer: Medicare Other | Admitting: Internal Medicine

## 2020-01-05 ENCOUNTER — Ambulatory Visit: Payer: Self-pay

## 2020-01-05 VITALS — BP 130/80 | HR 58 | Ht 64.0 in | Wt 207.0 lb

## 2020-01-05 DIAGNOSIS — S0990XA Unspecified injury of head, initial encounter: Secondary | ICD-10-CM | POA: Diagnosis not present

## 2020-01-05 DIAGNOSIS — G8929 Other chronic pain: Secondary | ICD-10-CM

## 2020-01-05 DIAGNOSIS — S060X1A Concussion with loss of consciousness of 30 minutes or less, initial encounter: Secondary | ICD-10-CM

## 2020-01-05 DIAGNOSIS — S42034A Nondisplaced fracture of lateral end of right clavicle, initial encounter for closed fracture: Secondary | ICD-10-CM

## 2020-01-05 DIAGNOSIS — M25511 Pain in right shoulder: Secondary | ICD-10-CM

## 2020-01-05 DIAGNOSIS — S060X9A Concussion with loss of consciousness of unspecified duration, initial encounter: Secondary | ICD-10-CM | POA: Insufficient documentation

## 2020-01-05 DIAGNOSIS — S42033A Displaced fracture of lateral end of unspecified clavicle, initial encounter for closed fracture: Secondary | ICD-10-CM | POA: Insufficient documentation

## 2020-01-05 DIAGNOSIS — S060XAA Concussion with loss of consciousness status unknown, initial encounter: Secondary | ICD-10-CM | POA: Insufficient documentation

## 2020-01-05 MED ORDER — AMOXICILLIN-POT CLAVULANATE 875-125 MG PO TABS
1.0000 | ORAL_TABLET | Freq: Two times a day (BID) | ORAL | 0 refills | Status: DC
Start: 2020-01-05 — End: 2020-03-05

## 2020-01-05 MED ORDER — VITAMIN D (ERGOCALCIFEROL) 1.25 MG (50000 UNIT) PO CAPS
50000.0000 [IU] | ORAL_CAPSULE | ORAL | 0 refills | Status: DC
Start: 2020-01-05 — End: 2020-11-12

## 2020-01-05 NOTE — Assessment & Plan Note (Addendum)
Acute Related to injury Associated with Dec ROM Also with upper back and posterior neck pain Will see sports med Tylenol, ultracet, topical meds, heat, ice Can try tizanidine
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
## 2020-01-05 NOTE — Assessment & Plan Note (Signed)
Patient likely did have some type of loss of consciousness.  Did review patient's reports of the imaging of the CT neck and the CT of the head.  Unremarkable of the CT of the head but the CT of the neck did show significant arthritic changes that could be contributing to some of the shoulder pain but also found to have a nondisplaced distal clavicle fracture that I think is contributing to more the pain.  At this point I would like patient to try conservative therapy we discussed over-the-counter medications that could be beneficial, vitamin D supplementation to help with the healing of the bone, patient given some mild exercises to do for the neck and we will monitor how patient will do.  Found to have more of a mastoiditis as well on the CT scans are treated with antibiotics.  Follow-up with me again in 7 to 10 weeks to see how we can have patient progress with the idea of likely physical therapy for the shoulder and potentially vestibular neuro

## 2020-01-05 NOTE — Patient Instructions (Addendum)
Good to see you Xray shoulder Once weekly vitamin D Augmentin 2 times a day for 10 days Fish oil 2 grams daily [id number] 200 mcg Exercise 3 times a week See me again in 7-10 days ok to book

## 2020-01-05 NOTE — Patient Instructions (Signed)
Keep taking the tylenol and ultracet.  Try the tizanidine.     See sports medicine downstairs.

## 2020-01-05 NOTE — Assessment & Plan Note (Signed)
Seen on ultrasound today with calculation of hypoechoic changes superficial to the distal clavicle.  Acromioclavicular joint very mild arthritic changes.  Rotator cuff has some very mild bursitis noted.  Patient has elected to try conservative therapy with topical anti-inflammatories and icing regimen.  I would like to hold on injection until we see some healing of the bone itself with a larger callus formation start with range of motion exercises at home and then at follow-up consider the possibility of formal physical therapy
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
## 2020-01-11 ENCOUNTER — Ambulatory Visit: Payer: Medicare Other | Admitting: Family Medicine

## 2020-01-11 ENCOUNTER — Encounter: Payer: Self-pay | Admitting: Family Medicine

## 2020-01-11 ENCOUNTER — Other Ambulatory Visit: Payer: Self-pay

## 2020-01-11 VITALS — BP 110/88 | HR 58 | Ht 64.0 in | Wt 206.0 lb

## 2020-01-11 DIAGNOSIS — M25511 Pain in right shoulder: Secondary | ICD-10-CM | POA: Diagnosis not present

## 2020-01-11 DIAGNOSIS — S42034A Nondisplaced fracture of lateral end of right clavicle, initial encounter for closed fracture: Secondary | ICD-10-CM

## 2020-01-11 DIAGNOSIS — S060X1D Concussion with loss of consciousness of 30 minutes or less, subsequent encounter: Secondary | ICD-10-CM | POA: Diagnosis not present

## 2020-01-11 NOTE — Patient Instructions (Signed)
PT for shoulder/neck Concussion-Keep doing vitamins Hope it is resolved 2-3 weeks See me in 3 weeks

## 2020-01-11 NOTE — Assessment & Plan Note (Signed)
Mild concussion but making significant improvement.  Patient states that her emotional liability and her concentration has improved already.  Continuing to have headaches but unfortunately patient has had had headaches at her baseline.  Discussed which activities to do which wants to potentially avoid.  Increase activity slowly.  Follow-up again in 3 weeks

## 2020-01-11 NOTE — Progress Notes (Signed)
Alicia Hunt [address] [place] [address] Phone: [phone] Subjective:    I'm seeing this patient by the request  of:  Pincus Sanes, MD  CC: Right shoulder and head injury follow-up  [id number]  Seen on ultrasound today with calculation of hypoechoic changes superficial to the distal clavicle.  Acromioclavicular joint very mild arthritic changes.  Rotator cuff has some very mild bursitis noted.  Patient has elected to try conservative therapy with topical anti-inflammatories and icing regimen.  I would like to hold on injection until we see some healing of the bone itself with a larger callus formation start with range of motion exercises at home and then at follow-up consider the possibility of formal physical therapy  Patient likely did have some type of loss of consciousness.  Did review patient's reports of the imaging of the CT neck and the CT of the head.  Unremarkable of the CT of the head but the CT of the neck did show significant arthritic changes that could be contributing to some of the shoulder pain but also found to have a nondisplaced distal clavicle fracture that I think is contributing to more the pain.  At this point I would like patient to try conservative therapy we discussed over-the-counter medications that could be beneficial, vitamin D supplementation to help with the healing of the bone, patient given some mild exercises to do for the neck and we will monitor how patient will do.  Found to have more of a mastoiditis as well on the CT scans are treated with antibiotics.  Follow-up with me again in 7 to 10 weeks to see how we can have patient progress with the idea of likely physical therapy for the shoulder and potentially vestibular neuro  Update 01/11/2020 Alicia Hunt is a 64 y.o. female coming in with complaint of concussion and distal clavicle fracture. States that she is doing better. Does have headaches daily but  they are not as painful as before. Also experiencing fatigue and upset stomach.  Overall though she would state that her emotions have been much better.  Feels like she can concentrate a little better.  Did have a lot of family pain in the last week which she thinks was very helpful as well  Right shoulder pain slowly improving. Very limited range of motion due to sharp pain.  Patient states that she has noticed she has been much more comfortable at night.  Has not tried any lifting.     Past Medical History:  Diagnosis Date  . Anxiety   . CPAP (continuous positive airway pressure) dependence   . Depression   . Diverticulitis   . Hyperlipidemia   . Hypertension   . Sleep apnea   . Thyroid disease    Past Surgical History:  Procedure Laterality Date  . ABDOMINAL HYSTERECTOMY    . BREAST EXCISIONAL BIOPSY Left 20+ years   pt states dr didnt remove lump  . CHOLECYSTECTOMY     Social History   Socioeconomic History  . Marital status: Married    Spouse name: Not on file  . Number of children: 2  . Years of education: Not on file  . Highest education level: Not on file  Occupational History  . Not on file  Tobacco Use  . Smoking status: Never Smoker  . Smokeless tobacco: Never Used  Substance and Sexual Activity  . Alcohol use: Yes  . Drug use: Never  . Sexual activity: Not  on file  Other Topics Concern  . Not on file  Social History Narrative  . Not on file   Social Determinants of Health   Financial Resource Strain:   . Difficulty of Paying Living Expenses: Not on file  Food Insecurity:   . Worried About Programme researcher, broadcasting/film/video in the Last Year: Not on file  . Ran Out of Food in the Last Year: Not on file  Transportation Needs:   . Lack of Transportation (Medical): Not on file  . Lack of Transportation (Non-Medical): Not on file  Physical Activity:   . Days of Exercise per Week: Not on file  . Minutes of Exercise per Session: Not on file  Stress:   . Feeling of  Stress : Not on file  Social Connections:   . Frequency of Communication with Friends and Family: Not on file  . Frequency of Social Gatherings with Friends and Family: Not on file  . Attends Religious Services: Not on file  . Active Member of Clubs or Organizations: Not on file  . Attends Banker Meetings: Not on file  . Marital Status: Not on file   No Known Allergies Family History  Problem Relation Age of Onset  . Stroke Mother 28       cause of death    Current Outpatient Medications (Endocrine & Metabolic):  .  levothyroxine (SYNTHROID) 50 MCG tablet, TAKE 1 TABLET BY MOUTH  DAILY  Current Outpatient Medications (Cardiovascular):  .  amLODipine-olmesartan (AZOR) 5-40 MG tablet, TAKE 1 TABLET BY MOUTH  DAILY .  atorvastatin (LIPITOR) 10 MG tablet, TAKE 1 TABLET BY MOUTH  DAILY   Current Outpatient Medications (Analgesics):  .  traMADol-acetaminophen (ULTRACET) 37.5-325 MG tablet, Take 1 tablet by mouth every 6 (six) hours as needed.   Current Outpatient Medications (Other):  .  amoxicillin-clavulanate (AUGMENTIN) 875-125 MG tablet, Take 1 tablet by mouth 2 (two) times daily. Marland Kitchen  escitalopram (LEXAPRO) 10 MG tablet, TAKE 1 TABLET BY MOUTH  DAILY .  Multiple Vitamin (MULTIVITAMIN) capsule, Take 1 capsule by mouth daily. .  Vitamin D, Ergocalciferol, (DRISDOL) 1.25 MG (50000 UNIT) CAPS capsule, Take 1 capsule (50,000 Units total) by mouth every 7 (seven) days.   Reviewed prior external information including notes and imaging from  primary care provider As well as notes that were available from care everywhere and other healthcare systems.  Past medical history, social, surgical and family history all reviewed in electronic medical record.  No pertanent information unless stated regarding to the chief complaint.   Review of Systems:  No , visual changes, nausea, vomiting, diarrhea, constipation, dizziness, abdominal pain, skin rash, fevers, chills, night sweats,  weight loss, swollen lymph nodes, body aches, joint swelling, chest pain, shortness of breath, mood changes. POSITIVE muscle aches, headaches  Objective  Blood pressure 110/88, pulse (!) 58, height 5\' 4"  (1.626 m), weight 206 lb (93.4 kg), SpO2 97 %.   General: No apparent distress alert and oriented x3 mood and affect normal, dressed appropriately.  HEENT: Pupils equal, extraocular movements intact mild horizontal nystagmus noted with right lateral gaze Respiratory: Patient's speak in full sentences and does not appear short of breath  Cardiovascular: No lower extremity edema, non tender, no erythema  Neuro: Cranial nerves II through XII are intact, neurovascularly intact in all extremities with 2+ DTRs and 2+ pulses.  Gait normal with good balance and coordination.  MSK:   Right shoulder still tender over the clavicle itself.  Tender over the  acromium and lateral.  The patient still has some difficulty lifting arm with its own secondary to more pain.  Good internal and external strength noted.  Neurovascularly intact distally  Neck exam does show some mild loss of lordosis and tightness noted that is consistent with mild muscle spasm.  Negative Spurling's  Patient did do very well with concentration.    Impression and Recommendations:     The above documentation has been reviewed and is accurate and complete Alicia Saa, DO       Note: This dictation was prepared with Dragon dictation along with smaller phrase technology. Any transcriptional errors that result from this process are unintentional.

## 2020-01-11 NOTE — Assessment & Plan Note (Signed)
Patient is slowly making some improvement.  I do believe that it is mostly in the clavicle.  We discussed the potential for advanced imaging secondary to the amount of pain patient declined.  Would like to start formal physical therapy in 2 weeks.  Order placed today.  Follow-up again in 2 to 3 weeks where I would like to repeat x-rays to look at the clavicle more appropriately as well as likely ultrasound again

## 2020-01-24 ENCOUNTER — Other Ambulatory Visit: Payer: Self-pay

## 2020-01-24 ENCOUNTER — Ambulatory Visit: Payer: Medicare Other | Attending: Family Medicine | Admitting: Physical Therapy

## 2020-01-24 ENCOUNTER — Encounter: Payer: Self-pay | Admitting: Physical Therapy

## 2020-01-24 DIAGNOSIS — M25511 Pain in right shoulder: Secondary | ICD-10-CM | POA: Insufficient documentation

## 2020-01-24 DIAGNOSIS — M25611 Stiffness of right shoulder, not elsewhere classified: Secondary | ICD-10-CM | POA: Diagnosis not present

## 2020-01-24 DIAGNOSIS — M6281 Muscle weakness (generalized): Secondary | ICD-10-CM
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
## 2020-01-27 ENCOUNTER — Other Ambulatory Visit: Payer: Self-pay

## 2020-01-27 ENCOUNTER — Ambulatory Visit: Payer: Medicare Other | Admitting: Physical Therapy

## 2020-01-27 DIAGNOSIS — M6281 Muscle weakness (generalized): Secondary | ICD-10-CM | POA: Diagnosis not present

## 2020-01-27 DIAGNOSIS — M25611 Stiffness of right shoulder, not elsewhere classified: Secondary | ICD-10-CM | POA: Diagnosis not present

## 2020-01-27 DIAGNOSIS — M25511 Pain in right shoulder: Secondary | ICD-10-CM

## 2020-01-27 NOTE — Therapy (Signed)
[facility] Outpatient [facility] [address] [place], [place], [address] Phone: [phone]   Fax:  [phone]  Physical Therapy Treatment  Patient Details  Name: Alicia Hunt MRN: [id number] Date of Birth: 12-21-1955 Referring Provider (PT): Antoine Primas, DO   Encounter Date: 01/27/2020   PT End of Session - 01/27/20 1146    Visit Number 2    Number of Visits 12    Date for PT Re-Evaluation 03/06/20    Authorization Type UHC Medicare, progress note by visit 10, recheck FOTO status at visit 6    PT Start Time 1100    PT Stop Time 1150    PT Time Calculation (min) 50 min    Activity Tolerance Patient limited by pain    Behavior During Therapy [facility] for tasks assessed/performed           Past Medical History:  Diagnosis Date   Anxiety    CPAP (continuous positive airway pressure) dependence    Depression    Diverticulitis    Hyperlipidemia    Hypertension    Sleep apnea    Thyroid disease     Past Surgical History:  Procedure Laterality Date   ABDOMINAL HYSTERECTOMY     BREAST EXCISIONAL BIOPSY Left 20+ years   pt states dr Archie Balboa remove lump   CHOLECYSTECTOMY      There were no vitals filed for this visit.                      [facility] Adult PT Treatment/Exercise - 01/27/20 0001      Shoulder Exercises: ROM/Strengthening   Other ROM/Strengthening Exercises pendulums foward / side to side/ circles  x10 each     Other ROM/Strengthening Exercises seated scap retraction 2x10; table slide x10 5 sec hold       Manual Therapy   Manual Therapy Joint mobilization;Soft tissue mobilization;Passive ROM    Joint Mobilization gentle posterior and inferior glides grade I and II     Soft tissue mobilization to upper trap     Passive ROM into flexion and ER pain free ranges                   PT Education - 01/27/20 1210    Education Details reviewed symptom mangement with motion    Person(s)  Educated Patient    Methods Explanation;Demonstration;Tactile cues;Verbal cues    Comprehension Verbalized understanding;Returned demonstration;Verbal cues required;Tactile cues required            PT Short Term Goals - 01/24/20 1233      PT SHORT TERM GOAL #1   Title Independent with HEP    Baseline needs HEP    Time 3    Period Weeks    Status New    Target Date 03/06/20      PT SHORT TERM GOAL #2   Title Increase right shoulder flexion and abduction AROM to at least 90 deg to work towards improved ability to use right UE for reaching ADLs and chores    Baseline flexion 45 deg, abduction 60 deg    Time 3    Target Date 02/14/20             PT Long Term Goals - 01/24/20 1234      PT LONG TERM GOAL #1   Title Increase right shoulder flexion and abduction AROM to 120 deg or greater and IR reach behind back to at least L4 or higher to improve  ability for dressing, bathing/grooming and reaching for chores    Baseline flexion 45 deg, abduction 60 degm IR reach limited to gluteals    Time 6    Period Weeks    Status New    Target Date 03/06/20      PT LONG TERM GOAL #2   Title Improve FOTO outcome measure score to 40% or less limitation due to shoulder    Baseline 64% limited    Time 6    Status New    Target Date 03/06/20      PT LONG TERM GOAL #3   Title Right shoulder strength at least grossly 4/5 or greater all planes of ROM to work toward right UE use for lifting for chores    Baseline MMTs not tested given recent s/p fracture but would rate flexion and abduction 3-/5 based on partial AROM against gravity    Time 6    Period Weeks    Status New    Target Date 03/06/20      PT LONG TERM GOAL #4   Title Decrease sleep disturbance symptoms due to right shoulder pain at least 75% or greater from current status    Baseline limited tolerance supine and right sidelying positions    Time 6    Period Weeks    Status New    Target Date 03/06/20      PT LONG TERM  GOAL #5   Title Perform reaching ADLs and light chores at home as needed with right shoulder pain 3/10 or less    Baseline 6/10, limited ability reaching ADLs due to weakness/ROM limitations, unable to perform lifting for chores    Time 6    Period Weeks    Status New    Target Date 03/06/20                 Plan - 01/27/20 1149    Clinical Impression Statement Patient continues to have pain but made some progress with her motion. Her motion improved with manual therapy. Therapy reviewed Her HEP. She has been working on using her arm more with fucntion. She was encouraged to listen to the pain but keep it moving as much as possible. Therapy will continue to progress as tolerated    Personal Factors and Comorbidities Age    Examination-Activity Limitations Bathing;Dressing;Sleep;Lift;Hygiene/Grooming;Carry;Reach Overhead    Examination-Participation Restrictions Driving;Cleaning;Laundry;Meal Prep    Stability/Clinical Decision Making Stable/Uncomplicated    Clinical Decision Making Low    Rehab Potential Good    PT Frequency 2x / week    PT Duration 6 weeks    PT Treatment/Interventions ADLs/Self Care Home Management;Cryotherapy;Iontophoresis 4mg /ml Dexamethasone;Moist Heat;Therapeutic exercise;Neuromuscular re-education;Manual techniques;Passive range of motion;Dry needling;Taping;Electrical Stimulation;Vasopneumatic Device    PT Next Visit Plan Review FOTO patient report by visit 3, review HEP as needed, no weights/strengthening for the first few therapy visits but continue/progess right shoulder AAROM/AROM as tolerated, progress to supine wand flexion, PROM/stretching, try pulleys, cryo/heat prn; Review FOTO !!!!!! progress to supine wnad flexio; consider addig light resitance to scpa strengthening.    PT Home Exercise Plan Access code: [email or web address], supine self-assisted right shoulder flexion using left hand to assist, supine wand ER, scapular retraction, table slides     Consulted and Agree with Plan of Care Patient           Patient will benefit from skilled therapeutic intervention in order to improve the following deficits and impairments:  Pain, Impaired UE functional use, Decreased strength, Decreased range of  motion, Hypomobility  Visit Diagnosis: Acute pain of right shoulder  Stiffness of right shoulder, not elsewhere classified  Muscle weakness (generalized)     Problem List Patient Active Problem List   Diagnosis Date Noted   Mild concussion 01/05/2020   Closed fracture of distal clavicle 01/05/2020   Right shoulder pain 01/05/2020   Head injury 01/05/2020   Constipation 11/15/2019   Right lower quadrant abdominal pain 11/15/2019   Lumbar radiculopathy 11/01/2019   Chest pain 09/13/2019   Burning sensation of feet 09/13/2019   OSA on CPAP 09/13/2019   Hyperuricemia 11/22/2018   Prediabetes 01/07/2018   Hypertension 01/06/2018   Hypothyroidism 01/06/2018   Vitamin D deficiency 01/06/2018   Low vitamin B12 level 01/06/2018   Depression 01/06/2018   Other hyperlipidemia 01/06/2018   H/O Diverticulitis of colon with bleeding 01/06/2018    Dessie Coma PT DPT  01/27/2020, 12:11 PM  [facility] [address] [place], [place], [address] Phone: [phone]   Fax:  [phone]  Name: Maryah Marinaro MRN: [id number] Date of Birth: 01-12-1956

## 2020-01-31 ENCOUNTER — Other Ambulatory Visit: Payer: Self-pay

## 2020-01-31 ENCOUNTER — Encounter: Payer: Self-pay | Admitting: Physical Therapy

## 2020-01-31 ENCOUNTER — Encounter: Payer: Medicare Other | Admitting: Physical Therapy

## 2020-01-31 ENCOUNTER — Ambulatory Visit: Payer: Medicare Other | Admitting: Physical Therapy

## 2020-01-31 DIAGNOSIS — M25611 Stiffness of right shoulder, not elsewhere classified: Secondary | ICD-10-CM

## 2020-01-31 DIAGNOSIS — M6281 Muscle weakness (generalized): Secondary | ICD-10-CM | POA: Diagnosis not present

## 2020-01-31 DIAGNOSIS — M25511 Pain in right shoulder: Secondary | ICD-10-CM

## 2020-02-01 ENCOUNTER — Encounter: Payer: Self-pay | Admitting: Physical Therapy

## 2020-02-01 NOTE — Therapy (Signed)
[facility] [address] [place], [place], [address] Phone: [phone]   Fax:  [phone]  Physical Therapy Treatment  Patient Details  Name: Alicia Hunt MRN: [id number] Date of Birth: 01/07/1956 Referring Provider (PT): Antoine Primas, DO   Encounter Date: 01/31/2020   PT End of Session - 01/31/20 1605    Visit Number 3    Number of Visits 12    Date for PT Re-Evaluation 03/06/20    Authorization Type UHC Medicare, progress note by visit 10, recheck FOTO status at visit 6    PT Start Time 1330    PT Stop Time 1421    PT Time Calculation (min) 51 min    Activity Tolerance Patient limited by pain    Behavior During Therapy [facility] for tasks assessed/performed           Past Medical History:  Diagnosis Date  . Anxiety   . CPAP (continuous positive airway pressure) dependence   . Depression   . Diverticulitis   . Hyperlipidemia   . Hypertension   . Sleep apnea   . Thyroid disease     Past Surgical History:  Procedure Laterality Date  . ABDOMINAL HYSTERECTOMY    . BREAST EXCISIONAL BIOPSY Left 20+ years   pt states dr didnt remove lump  . CHOLECYSTECTOMY      There were no vitals filed for this visit.   Subjective Assessment - 01/31/20 1600    Subjective Patient reports she was sore after the last visit but it was mild. she was also sore after swimming. She was moslty using her legs but her shoulder had some soreness. Most of her soreness is in her anterior shoulder.    Limitations Lifting;House hold activities    Patient Stated Goals return to activities such as gardening, playing with granchild, tennis    Currently in Pain? Yes    Pain Score 4     Pain Location Shoulder    Pain Orientation Right    Pain Descriptors / Indicators Aching    Pain Type Acute pain    Pain Onset 1 to 4 weeks ago    Pain Frequency Constant    Aggravating Factors  movement and activity    Pain Relieving Factors  medication and heat    Effect of Pain on Daily Activities lkimits ability to reach    Multiple Pain Sites No                             [facility] Adult PT Treatment/Exercise - 02/01/20 0001      Shoulder Exercises: Supine   Other Supine Exercises attmepted chest press with wand but patient had significant pain exercise halted after 2 resp;     Other Supine Exercises AAROM: ER 2x10      Shoulder Exercises: Standing   Other Standing Exercises scap retraction x20       Shoulder Exercises: ROM/Strengthening   Other ROM/Strengthening Exercises seated scap retraction 2x10; table slide x10 5 sec hold       Shoulder Exercises: Isometric Strengthening   External Rotation Limitations 2x10 25% pressure     Internal Rotation Limitations 2x10 25% pressure      Manual Therapy   Manual Therapy Joint mobilization;Soft tissue mobilization;Passive ROM    Joint Mobilization gentle posterior and inferior glides grade I and II     Soft tissue mobilization to upper trap     Passive ROM into  flexion and ER pain free ranges                   PT Education - 01/31/20 1605    Education Details reviewed activity progression    Person(s) Educated Patient    Methods Demonstration;Explanation;Tactile cues;Verbal cues    Comprehension Verbalized understanding;Returned demonstration;Verbal cues required;Tactile cues required            PT Short Term Goals - 01/24/20 1233      PT SHORT TERM GOAL #1   Title Independent with HEP    Baseline needs HEP    Time 3    Period Weeks    Status New    Target Date 03/06/20      PT SHORT TERM GOAL #2   Title Increase right shoulder flexion and abduction AROM to at least 90 deg to work towards improved ability to use right UE for reaching ADLs and chores    Baseline flexion 45 deg, abduction 60 deg    Time 3    Target Date 02/14/20             PT Long Term Goals - 01/24/20 1234      PT LONG TERM GOAL #1   Title Increase right  shoulder flexion and abduction AROM to 120 deg or greater and IR reach behind back to at least L4 or higher to improve ability for dressing, bathing/grooming and reaching for chores    Baseline flexion 45 deg, abduction 60 degm IR reach limited to gluteals    Time 6    Period Weeks    Status New    Target Date 03/06/20      PT LONG TERM GOAL #2   Title Improve FOTO outcome measure score to 40% or less limitation due to shoulder    Baseline 64% limited    Time 6    Status New    Target Date 03/06/20      PT LONG TERM GOAL #3   Title Right shoulder strength at least grossly 4/5 or greater all planes of ROM to work toward right UE use for lifting for chores    Baseline MMTs not tested given recent s/p fracture but would rate flexion and abduction 3-/5 based on partial AROM against gravity    Time 6    Period Weeks    Status New    Target Date 03/06/20      PT LONG TERM GOAL #4   Title Decrease sleep disturbance symptoms due to right shoulder pain at least 75% or greater from current status    Baseline limited tolerance supine and right sidelying positions    Time 6    Period Weeks    Status New    Target Date 03/06/20      PT LONG TERM GOAL #5   Title Perform reaching ADLs and light chores at home as needed with right shoulder pain 3/10 or less    Baseline 6/10, limited ability reaching ADLs due to weakness/ROM limitations, unable to perform lifting for chores    Time 6    Period Weeks    Status New    Target Date 03/06/20                 Plan - 01/31/20 1605    Clinical Impression Statement Patients passive ROM improved significantly. She had pain at end ranges but both er and flexion were nearly full. She had difficulty with wand press. Therapy intiated light isomteric  ER and IR at 25% of potential power production. She had a mild increase in pain. Most of the patients pain is in her anterior shoulder. Her ultrasound showed bursitis.    Examination-Activity Limitations  Bathing;Dressing;Sleep;Lift;Hygiene/Grooming;Carry;Reach Overhead    Examination-Participation Restrictions Driving;Cleaning;Laundry;Meal Prep    Stability/Clinical Decision Making Stable/Uncomplicated    Clinical Decision Making Low    Rehab Potential Good    PT Frequency 2x / week    PT Duration 6 weeks    PT Treatment/Interventions ADLs/Self Care Home Management;Cryotherapy;Iontophoresis 4mg /ml Dexamethasone;Moist Heat;Therapeutic exercise;Neuromuscular re-education;Manual techniques;Passive range of motion;Hunt needling;Taping;Electrical Stimulation;Vasopneumatic Device    PT Next Visit Plan Review FOTO patient report by visit 3, review HEP as needed, no weights/strengthening for the first few therapy visits but continue/progess right shoulder AAROM/AROM as tolerated, progress to supine wand flexion, PROM/stretching, try pulleys, cryo/heat prn; Review FOTO !!!!!! progress to supine wnad flexio; consider addig light resitance to scpa strengthening.    PT Home Exercise Plan Access code: [email or web address], supine self-assisted right shoulder flexion using left hand to assist, supine wand ER, scapular retraction, table slides    Consulted and Agree with Plan of Care Patient           Patient will benefit from skilled therapeutic intervention in order to improve the following deficits and impairments:  Pain, Impaired UE functional use, Decreased strength, Decreased range of motion, Hypomobility  Visit Diagnosis: Acute pain of right shoulder  Stiffness of right shoulder, not elsewhere classified  Muscle weakness (generalized)     Problem List Patient Active Problem List   Diagnosis Date Noted  . Mild concussion 01/05/2020  . Closed fracture of distal clavicle 01/05/2020  . Right shoulder pain 01/05/2020  . Head injury 01/05/2020  . Constipation 11/15/2019  . Right lower quadrant abdominal pain 11/15/2019  . Lumbar radiculopathy 11/01/2019  . Chest pain 09/13/2019  . Burning  sensation of feet 09/13/2019  . OSA on CPAP 09/13/2019  . 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 Prediabetes 01/07/2018  . Hypertension 01/06/2018  . Hypothyroidism 01/06/2018  . Vitamin D deficiency 01/06/2018  . Low vitamin B12 level 01/06/2018  . Depression 01/06/2018  . Other hyperlipidemia 01/06/2018  . H/O Diverticulitis of colon with bleeding 01/06/2018    01/08/2018 PT DPT  02/01/2020, 1:22 PM  [facility] [address] [place], [place], [place] Phone: [phone]   Fax:  [phone]  Name: Alicia Hunt MRN: [name] Date of Birth: [birth year]

## 2020-02-02 ENCOUNTER — Ambulatory Visit: Payer: Medicare Other | Admitting: Physical Therapy

## 2020-02-02 ENCOUNTER — Encounter: Payer: Self-pay | Admitting: Physical Therapy

## 2020-02-02 ENCOUNTER — Other Ambulatory Visit: Payer: Self-pay

## 2020-02-02 DIAGNOSIS — M25611 Stiffness of right shoulder, not elsewhere classified: Secondary | ICD-10-CM

## 2020-02-02 DIAGNOSIS — M25511 Pain in right shoulder: Secondary | ICD-10-CM | POA: Diagnosis not present

## 2020-02-02 DIAGNOSIS — M6281 Muscle weakness (generalized): Secondary | ICD-10-CM

## 2020-02-02 NOTE — Therapy (Signed)
[facility] Outpatient Rehabilitation [facility] [address] [place], [place], [address] Phone: [phone]   Fax:  [phone]  Physical Therapy Treatment  Patient Details  Name: Alicia Hunt MRN: [id number] Date of Birth: [birth year] Referring Provider (PT): Antoine Primas, DO   Encounter Date: 02/02/2020   PT End of Session - 02/02/20 1419    Visit Number 4    Number of Visits 12    Date for PT Re-Evaluation 03/06/20    Authorization Type UHC Medicare, progress note by visit 10, recheck FOTO status at visit 6    PT Start Time 1418    PT Stop Time 1508    PT Time Calculation (min) 50 min    Activity Tolerance Patient limited by pain    Behavior During Therapy [facility] for tasks assessed/performed           Past Medical History:  Diagnosis Date  . Anxiety   . CPAP (continuous positive airway pressure) dependence   . Depression   . Diverticulitis   . Hyperlipidemia   . Hypertension   . Sleep apnea   . Thyroid disease     Past Surgical History:  Procedure Laterality Date  . ABDOMINAL HYSTERECTOMY    . BREAST EXCISIONAL BIOPSY Left 20+ years   pt states dr didnt remove lump  . CHOLECYSTECTOMY      There were no vitals filed for this visit.   Subjective Assessment - 02/02/20 1433    Subjective Right shoulder pain about a 5/10 this PM. Using swimming pool to assist shoulder ROM helpful.                             [facility] Adult PT Treatment/Exercise - 02/02/20 0001      Exercises   Exercises Elbow;Wrist      Elbow Exercises   Elbow Flexion AROM;Right    Elbow Flexion Limitations 3 sets of 10      Shoulder Exercises: Supine   Other Supine Exercises AAROM: ER 2x10      Shoulder Exercises: Sidelying   External Rotation AROM;Right;20 reps    External Rotation Limitations partial ROM within pt. tolerance      Shoulder Exercises: Standing   Other Standing Exercises standing flexion and scaption AAROM with P-ball roll  on high plinth 2x10 ea. with 55 cm ball    Other Standing Exercises standing "saw" row right side 2x10      Shoulder Exercises: Pulleys   Flexion 2 minutes    Scaption 2 minutes    Scaption Limitations palms up, cues for hand position      Wrist Exercises   Wrist Flexion AROM;Strengthening;Right;20 reps;Seated    Bar Weights/Barbell (Wrist Flexion) 2 lbs    Wrist Extension AROM;Strengthening;Right;20 reps;Seated    Bar Weights/Barbell (Wrist Extension) 1 lb    Other wrist exercises tennis ball squeeze x 20 reps    Other wrist exercises forearm pronation/supination x 20 reps      Modalities   Modalities Cryotherapy      Cryotherapy   Number Minutes Cryotherapy 10 Minutes    Cryotherapy Location Shoulder    Type of Cryotherapy Ice pack      Manual Therapy   Soft tissue mobilization right posterior scapular region in left sidelying    Passive ROM right shoulder flexion emphasis as well as ER/IR                  PT Education -  02/02/20 1436    Education Details FOTO patient report    Person(s) Educated Patient    Methods Explanation    Comprehension Verbalized understanding            PT Short Term Goals - 01/24/20 1233      PT SHORT TERM GOAL #1   Title Independent with HEP    Baseline needs HEP    Time 3    Period Weeks    Status New    Target Date 03/06/20      PT SHORT TERM GOAL #2   Title Increase right shoulder flexion and abduction AROM to at least 90 deg to work towards improved ability to use right UE for reaching ADLs and chores    Baseline flexion 45 deg, abduction 60 deg    Time 3    Target Date 02/14/20             PT Long Term Goals - 01/24/20 1234      PT LONG TERM GOAL #1   Title Increase right shoulder flexion and abduction AROM to 120 deg or greater and IR reach behind back to at least L4 or higher to improve ability for dressing, bathing/grooming and reaching for chores    Baseline flexion 45 deg, abduction 60 degm IR reach  limited to gluteals    Time 6    Period Weeks    Status New    Target Date 03/06/20      PT LONG TERM GOAL #2   Title Improve FOTO outcome measure score to 40% or less limitation due to shoulder    Baseline 64% limited    Time 6    Status New    Target Date 03/06/20      PT LONG TERM GOAL #3   Title Right shoulder strength at least grossly 4/5 or greater all planes of ROM to work toward right UE use for lifting for chores    Baseline MMTs not tested given recent s/p fracture but would rate flexion and abduction 3-/5 based on partial AROM against gravity    Time 6    Period Weeks    Status New    Target Date 03/06/20      PT LONG TERM GOAL #4   Title Decrease sleep disturbance symptoms due to right shoulder pain at least 75% or greater from current status    Baseline limited tolerance supine and right sidelying positions    Time 6    Period Weeks    Status New    Target Date 03/06/20      PT LONG TERM GOAL #5   Title Perform reaching ADLs and light chores at home as needed with right shoulder pain 3/10 or less    Baseline 6/10, limited ability reaching ADLs due to weakness/ROM limitations, unable to perform lifting for chores    Time 6    Period Weeks    Status New    Target Date 03/06/20                 Plan - 02/02/20 1436    Clinical Impression Statement Still limited/painful with attempted shoulder flexion AAROM but improved tolerance with seated + standing variations as noted per flowsheet vs. supine positioning. Progress for therapy goals ongoing with AROM ability still limited by shoulder pain and weakness. Added some light resistance for wrist and grip strength today but holding off on any shoulder strengthening until ability and tolerance for shoulder AAROM/AROM improves.  Personal Factors and Comorbidities Age    Examination-Activity Limitations Bathing;Dressing;Sleep;Lift;Hygiene/Grooming;Carry;Reach Overhead    Examination-Participation Restrictions  Driving;Cleaning;Laundry;Meal Prep    Stability/Clinical Decision Making Stable/Uncomplicated    Clinical Decision Making Low    Rehab Potential Good    PT Frequency 2x / week    PT Duration 6 weeks    PT Treatment/Interventions ADLs/Self Care Home Management;Cryotherapy;Iontophoresis 4mg /ml Dexamethasone;Moist Heat;Therapeutic exercise;Neuromuscular re-education;Manual techniques;Passive range of motion;Dry needling;Taping;Electrical Stimulation;Vasopneumatic Device    PT Next Visit Plan continue/progess right shoulder AAROM/AROM as tolerated, PROM/stretching, try pulleys, cryo/heat prn, continue light strengthening for wrist and elbow as tolerated, for shoulder addition of resistance will be pending progression of AAROM and AROM but OK add light strengthening as tolerated pending progess over the next few sessions    PT Home Exercise Plan Access code: [email or web address], supine self-assisted right shoulder flexion using left hand to assist, supine wand ER, scapular retraction, table slides    Consulted and Agree with Plan of Care Patient           Patient will benefit from skilled therapeutic intervention in order to improve the following deficits and impairments:  Pain, Impaired UE functional use, Decreased strength, Decreased range of motion, Hypomobility  Visit Diagnosis: Acute pain of right shoulder  Stiffness of right shoulder, not elsewhere classified  Muscle weakness (generalized)     Problem List Patient Active Problem List   Diagnosis Date Noted  . Mild concussion 01/05/2020  . Closed fracture of distal clavicle 01/05/2020  . Right shoulder pain 01/05/2020  . Head injury 01/05/2020  . Constipation 11/15/2019  . Right lower quadrant abdominal pain 11/15/2019  . Lumbar radiculopathy 11/01/2019  . Chest pain 09/13/2019  . Burning sensation of feet 09/13/2019  . OSA on CPAP 09/13/2019  . 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 Prediabetes 01/07/2018  . Hypertension 01/06/2018  .  Hypothyroidism 01/06/2018  . Vitamin D deficiency 01/06/2018  . Low vitamin B12 level 01/06/2018  . Depression 01/06/2018  . Other hyperlipidemia 01/06/2018  . H/O Diverticulitis of colon with bleeding 01/06/2018    01/08/2018, PT, DPT 02/02/20 3:01 PM  [facility] [address] [place], [place], [place] Phone: [phone]   Fax:  [phone]  Name: Alicia Hunt MRN: [name] Date of Birth: 1955/05/24

## 2020-02-07 ENCOUNTER — Other Ambulatory Visit: Payer: Self-pay

## 2020-02-07 ENCOUNTER — Encounter: Payer: Self-pay | Admitting: Family Medicine

## 2020-02-07 ENCOUNTER — Ambulatory Visit: Payer: Medicare Other | Admitting: Family Medicine

## 2020-02-07 DIAGNOSIS — S060X1D Concussion with loss of consciousness of 30 minutes or less, subsequent encounter: Secondary | ICD-10-CM

## 2020-02-07 DIAGNOSIS — S42034D Nondisplaced fracture of lateral end of right clavicle, subsequent encounter for fracture with routine healing: Secondary | ICD-10-CM

## 2020-02-07 MED ORDER — TIZANIDINE HCL 4 MG PO TABS: 4.0000 mg | ORAL_TABLET | Freq: Every evening | ORAL | 2 refills | Status: AC

## 2020-02-07 NOTE — Assessment & Plan Note (Signed)
Patient is doing very well and almost completely resolved torticollis with a compression at this time.  I would like to follow-up with him in 4 weeks but I believe patient will fully recover.

## 2020-02-07 NOTE — Progress Notes (Signed)
[facility] [address] [place] [address] Phone: [phone] Subjective:   I Alicia Hunt am serving as a Neurosurgeon for Dr. Antoine Primas.  This visit occurred during the SARS-CoV-2 public health emergency.  Safety protocols were in place, including screening questions prior to the visit, additional usage of staff PPE, and extensive cleaning of exam room while observing appropriate contact time as indicated for disinfecting solutions.   I'm seeing this patient by the request  of:  Pincus Sanes, MD  CC: Right shoulder pain concussion follow-up  XNA:[id number]   01/11/2020 Patient is slowly making some improvement.  I do believe that it is mostly in the clavicle.  We discussed the potential for advanced imaging secondary to the amount of pain patient declined.  Would like to start formal physical therapy in 2 weeks.  Order placed today.  Follow-up again in 2 to 3 weeks where I would like to repeat x-rays to look at the clavicle more appropriately as well as likely ultrasound again  Mild concussion but making significant improvement.  Patient states that her emotional liability and her concentration has improved already.  Continuing to have headaches but unfortunately patient has had had headaches at her baseline.  Discussed which activities to do which wants to potentially avoid.  Increase activity slowly.  Follow-up again in 3 weeks  Update 02/07/2020 Alicia Hunt is a 64 y.o. female coming in with complaint of right shoulder pain and head injury. Patient states she has been going to physical therapy. Patient states she is doing better. Still having pain. States she is trying to stay active. PT is going excellent.  Patient feels like the head injury she is doing almost completely better at this point is near her baseline.  Now resolving the shoulder still.  Patient states that the pain is better but he does not have as much improvement in range of  motion at this time still.       Past Medical History:  Diagnosis Date   Anxiety    CPAP (continuous positive airway pressure) dependence    Depression    Diverticulitis    Hyperlipidemia    Hypertension    Sleep apnea    Thyroid disease    Past Surgical History:  Procedure Laterality Date   ABDOMINAL HYSTERECTOMY     BREAST EXCISIONAL BIOPSY Left 20+ years   pt states dr didnt remove lump   CHOLECYSTECTOMY     Social History   Socioeconomic History   Marital status: Married    Spouse name: Not on file   Number of children: 2   Years of education: Not on file   Highest education level: Not on file  Occupational History   Not on file  Tobacco Use   Smoking status: Never Smoker   Smokeless tobacco: Never Used  Substance and Sexual Activity   Alcohol use: Yes   Drug use: Never   Sexual activity: Not on file  Other Topics Concern   Not on file  Social History Narrative   Not on file   Social Determinants of Health   Financial Resource Strain:    Difficulty of Paying Living Expenses: Not on file  Food Insecurity:    Worried About Running Out of Food in the Last Year: Not on file   Ran Out of Food in the Last Year: Not on file  Transportation Needs:    Lack of Transportation (Medical): Not on file   Lack of  Transportation (Non-Medical): Not on file  Physical Activity:    Days of Exercise per Week: Not on file   Minutes of Exercise per Session: Not on file  Stress:    Feeling of Stress : Not on file  Social Connections:    Frequency of Communication with Friends and Family: Not on file   Frequency of Social Gatherings with Friends and Family: Not on file   Attends Religious Services: Not on file   Active Member of Clubs or Organizations: Not on file   Attends Banker Meetings: Not on file   Marital Status: Not on file   No Known Allergies Family History  Problem Relation Age of Onset   Stroke Mother 27        cause of death    Current Outpatient Medications (Endocrine & Metabolic):    levothyroxine (SYNTHROID) 50 MCG tablet, TAKE 1 TABLET BY MOUTH  DAILY  Current Outpatient Medications (Cardiovascular):    amLODipine-olmesartan (AZOR) 5-40 MG tablet, TAKE 1 TABLET BY MOUTH  DAILY   atorvastatin (LIPITOR) 10 MG tablet, TAKE 1 TABLET BY MOUTH  DAILY   Current Outpatient Medications (Analgesics):    traMADol-acetaminophen (ULTRACET) 37.5-325 MG tablet, Take 1 tablet by mouth every 6 (six) hours as needed.   Current Outpatient Medications (Other):    amoxicillin-clavulanate (AUGMENTIN) 875-125 MG tablet, Take 1 tablet by mouth 2 (two) times daily.   escitalopram (LEXAPRO) 10 MG tablet, TAKE 1 TABLET BY MOUTH  DAILY   Multiple Vitamin (MULTIVITAMIN) capsule, Take 1 capsule by mouth daily.   Vitamin D, Ergocalciferol, (DRISDOL) 1.25 MG (50000 UNIT) CAPS capsule, Take 1 capsule (50,000 Units total) by mouth every 7 (seven) days.   tiZANidine (ZANAFLEX) 4 MG tablet, Take 1 tablet (4 mg total) by mouth Nightly for 10 days.   Reviewed prior external information including notes and imaging from  primary care provider As well as notes that were available from care everywhere and other healthcare systems.  Past medical history, social, surgical and family history all reviewed in electronic medical record.  No pertanent information unless stated regarding to the chief complaint.   Review of Systems:  No headache, visual changes, nausea, vomiting, diarrhea, constipation, dizziness, abdominal pain, skin rash, fevers, chills, night sweats, weight loss, swollen lymph nodes, body aches, joint swelling, chest pain, shortness of breath, mood changes. POSITIVE muscle aches following the shoulder.  Objective  Blood pressure 140/88, pulse (!) 58, height 5\' 4"  (1.626 m), weight 211 lb (95.7 kg), SpO2 98 %.   General: No apparent distress alert and oriented x3 mood and affect normal, dressed  appropriately.  HEENT: Pupils equal, extraocular movements intact no signs of nystagmus. Patient her shoulder still has significant voluntary guarding noted.  Patient does passively have near full range of motion but does need to do a flow.  Tenderness stable over the distal clavicle specifically worse.  Tightness noted of the sternocleidomastoid on the right side valgus.  Patient's rotator cuff strength is difficult to assess secondary to patient's voluntary guarding    Impression and Recommendations:     The above documentation has been reviewed and is accurate and complete , DO       Note: This dictation was prepared with Dragon dictation along with smaller phrase technology. Any transcriptional errors that result from this process are unintentional.

## 2020-02-07 NOTE — Patient Instructions (Addendum)
Good to see you Keep up PT [place]  See me again in 4 weeks before you go to [place]. We will discuss meds and injection

## 2020-02-07 NOTE — Assessment & Plan Note (Signed)
Patient appears to be healing just fine.  There was a nondisplaced fracture and she will do just okay with the physical therapy.  Patient still does have a lot of significant voluntary guarding.  We discussed the potential of an MRI.  Patient wants to hold at this point because she would not consider the surgical intervention as important at this time.  Patient is also ideally wanting to go back to [place] at the end of next month.  I would like to see her again in 4 weeks to further evaluate and at that time we will discuss the possibility of injection, medications, and then advanced imaging if warranted.

## 2020-02-08 ENCOUNTER — Ambulatory Visit: Payer: Medicare Other | Admitting: Physical Therapy

## 2020-02-08 ENCOUNTER — Encounter: Payer: Self-pay | Admitting: Physical Therapy

## 2020-02-08 ENCOUNTER — Other Ambulatory Visit: Payer: Self-pay

## 2020-02-08 DIAGNOSIS — M25611 Stiffness of right shoulder, not elsewhere classified: Secondary | ICD-10-CM

## 2020-02-08 DIAGNOSIS — M25511 Pain in right shoulder: Secondary | ICD-10-CM

## 2020-02-08 DIAGNOSIS — M6281 Muscle weakness (generalized): Secondary | ICD-10-CM | POA: Diagnosis not present

## 2020-02-08 NOTE — Therapy (Signed)
[place], [place], [address] Phone: [phone]   Fax:  [phone]  Physical Therapy Treatment  Patient Details  Name: Alicia Hunt MRN: [id number] Date of Birth: 1956-03-24 Referring Provider (PT): Hulan Saas, DO   Encounter Date: 02/08/2020   PT End of Session - 02/08/20 1421    Visit Number 5    Number of Visits 12    Date for PT Re-Evaluation 03/06/20    Authorization Type [employer] Medicare, progress note by visit 10, recheck FOTO status at visit 6    PT Start Time 1412    PT Stop Time 1502    PT Time Calculation (min) 50 min    Activity Tolerance Patient limited by pain    Behavior During Therapy [facility] for tasks assessed/performed           Past Medical History:  Diagnosis Date  . Anxiety   . CPAP (continuous positive airway pressure) dependence   . Depression   . Diverticulitis   . Hyperlipidemia   . Hypertension   . Sleep apnea   . Thyroid disease     Past Surgical History:  Procedure Laterality Date  . ABDOMINAL HYSTERECTOMY    . BREAST EXCISIONAL BIOPSY Left 20+ years   pt states dr didnt remove lump  . CHOLECYSTECTOMY      There were no vitals filed for this visit.   Subjective Assessment - 02/08/20 1414    Subjective Right shoulder pain 3/10 this PM. Pt. saw Dr. Tamala Julian yesterday for follow up and plan is to continue PT for now and follow up again (with Dr. Tamala Julian) in 4 weeks to see if further imaging such as MRI needed. Overall feels shoulder improving but still limited in reaching ability by pain.    Currently in Pain? Yes    Pain Score 3     Pain Location Shoulder    Pain Orientation Right    Pain Descriptors / Indicators Aching    Pain Type Acute pain    Pain Onset More than a month ago    Pain Frequency Constant    Aggravating Factors  shoulder motion, reaching    Pain Relieving Factors medication and heat    Effect of Pain on Daily Activities limits reaching  ability              [facility] PT Assessment - 02/08/20 0001      AROM   Right Shoulder Flexion 70 Degrees    Right Shoulder ABduction 70 Degrees                         [facility] Adult PT Treatment/Exercise - 02/08/20 0001      Elbow Exercises   Elbow Flexion AROM;Right;20 reps;Supine    Bar Weights/Barbell (Elbow Flexion) 1 lb    Elbow Flexion Limitations 2 sets of 10      Shoulder Exercises: Sidelying   External Rotation AROM;Right;20 reps    Other Sidelying Exercises sidelying right shoulder fleixon AAROM with UE ranger x 20 reps      Shoulder Exercises: Standing   Other Standing Exercises standing flexion and scaption AAROM with P-ball roll on high plinth 2x10 ea. with 55 cm ball    Other Standing Exercises standing "saw" row right side 2x10      Shoulder Exercises: Pulleys   Flexion 2 minutes    Scaption 2 minutes    Scaption Limitations palms up, cues for hand position  Shoulder Exercises: Isometric Strengthening   Flexion Limitations 5 sec x 10 reps    Extension Limitations 5 sec x 10 reps    External Rotation Limitations 5 sec x 10 reps    Internal Rotation Limitations 5 sec x 10 reps    ABduction Limitations 5 sec x 10 reps    ADduction Limitations 5 sec x 10 reps      Wrist Exercises   Wrist Flexion AROM;Strengthening;Right;20 reps;Seated    Bar Weights/Barbell (Wrist Flexion) 1 lb    Wrist Extension AROM;Strengthening;Right;20 reps;Seated    Bar Weights/Barbell (Wrist Extension) 1 lb      Cryotherapy   Number Minutes Cryotherapy 10 Minutes    Cryotherapy Location Shoulder    Type of Cryotherapy Ice pack      Manual Therapy   Passive ROM right shoulder all directions                    PT Short Term Goals - 02/08/20 1419      PT SHORT TERM GOAL #1   Title Independent with HEP    Baseline met for initial HEP, will update prn    Time 3    Period Weeks    Status Achieved      PT SHORT TERM GOAL #2   Title Increase right  shoulder flexion and abduction AROM to at least 90 deg to work towards improved ability to use right UE for reaching ADLs and chores    Baseline 70 deg 02/08/20    Time 3    Period Weeks    Status On-going             PT Long Term Goals - 01/24/20 1234      PT LONG TERM GOAL #1   Title Increase right shoulder flexion and abduction AROM to 120 deg or greater and IR reach behind back to at least L4 or higher to improve ability for dressing, bathing/grooming and reaching for chores    Baseline flexion 45 deg, abduction 60 degm IR reach limited to gluteals    Time 6    Period Weeks    Status New    Target Date 03/06/20      PT LONG TERM GOAL #2   Title Improve FOTO outcome measure score to 40% or less limitation due to shoulder    Baseline 64% limited    Time 6    Status New    Target Date 03/06/20      PT LONG TERM GOAL #3   Title Right shoulder strength at least grossly 4/5 or greater all planes of ROM to work toward right UE use for lifting for chores    Baseline MMTs not tested given recent s/p fracture but would rate flexion and abduction 3-/5 based on partial AROM against gravity    Time 6    Period Weeks    Status New    Target Date 03/06/20      PT LONG TERM GOAL #4   Title Decrease sleep disturbance symptoms due to right shoulder pain at least 75% or greater from current status    Baseline limited tolerance supine and right sidelying positions    Time 6    Period Weeks    Status New    Target Date 03/06/20      PT LONG TERM GOAL #5   Title Perform reaching ADLs and light chores at home as needed with right shoulder pain 3/10 or less  Baseline 6/10, limited ability reaching ADLs due to weakness/ROM limitations, unable to perform lifting for chores    Time 6    Period Weeks    Status New    Target Date 03/06/20                 Plan - 02/08/20 1429    Clinical Impression Statement Improving shoulder flexion and abduction AROM from baseline but still  limited/painful at 70 deg AROM. Added light shoulder isometrics today with good tolerance, primary pain noted in AROM for flexion and abduction-given pain in standing vs. supine for flexion tried sidelying AAROM with UE ranger-still with pain but tolerance improved vs. other positions previously attempted for AAROM. Pt. would benefit from continued PT for further progress to improve fnuctional status for RUE use.    Personal Factors and Comorbidities Age    Examination-Activity Limitations Bathing;Dressing;Sleep;Lift;Hygiene/Grooming;Carry;Reach Overhead    Examination-Participation Restrictions Driving;Cleaning;Laundry;Meal Prep    Stability/Clinical Decision Making Stable/Uncomplicated    Clinical Decision Making Low    Rehab Potential Good    PT Frequency 2x / week    PT Duration 6 weeks    PT Treatment/Interventions ADLs/Self Care Home Management;Cryotherapy;Iontophoresis 35m/ml Dexamethasone;Moist Heat;Therapeutic exercise;Neuromuscular re-education;Manual techniques;Passive range of motion;Dry needling;Taping;Electrical Stimulation;Vasopneumatic Device    PT Next Visit Plan FOTO status next visit, continue shoulder PROM/AAROM/AROM as tolerated, light isometrics, elbow and wrist ROM as needed    PT Home Exercise Plan Access code: [email or web address], supine self-assisted right shoulder flexion using left hand to assist, supine wand ER, scapular retraction, table slides    Consulted and Agree with Plan of Care Patient           Patient will benefit from skilled therapeutic intervention in order to improve the following deficits and impairments:  Pain, Impaired UE functional use, Decreased strength, Decreased range of motion, Hypomobility  Visit Diagnosis: Acute pain of right shoulder  Stiffness of right shoulder, not elsewhere classified  Muscle weakness (generalized)     Problem List Patient Active Problem List   Diagnosis Date Noted  . Mild concussion 01/05/2020  . Closed  fracture of distal clavicle 01/05/2020  . Right shoulder pain 01/05/2020  . Head injury 01/05/2020  . Constipation 11/15/2019  . Right lower quadrant abdominal pain 11/15/2019  . Lumbar radiculopathy 11/01/2019  . Chest pain 09/13/2019  . Burning sensation of feet 09/13/2019  . OSA on CPAP 09/13/2019  . 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 Prediabetes 01/07/2018  . Hypertension 01/06/2018  . Hypothyroidism 01/06/2018  . Vitamin D deficiency 01/06/2018  . Low vitamin B12 level 01/06/2018  . Depression 01/06/2018  . Other hyperlipidemia 01/06/2018  . H/O Diverticulitis of colon with bleeding 01/06/2018    CBeaulah Dinning PT, DPT 02/08/20 2:55 PM  [facility]GFord N[place] [address]Phone: [phone]  Fax:  [phone] Name: Alicia PlatasMRN: [id number] of Birth: 6Apr 21, 1957

## 2020-02-10 ENCOUNTER — Encounter: Payer: Self-pay | Admitting: Physical Therapy

## 2020-02-10 ENCOUNTER — Ambulatory Visit: Payer: Medicare Other | Admitting: Physical Therapy

## 2020-02-10 ENCOUNTER — Other Ambulatory Visit: Payer: Self-pay

## 2020-02-10 DIAGNOSIS — M6281 Muscle weakness (generalized): Secondary | ICD-10-CM

## 2020-02-10 DIAGNOSIS — M25611 Stiffness of right shoulder, not elsewhere classified: Secondary | ICD-10-CM

## 2020-02-10 DIAGNOSIS — M25511 Pain in right shoulder: Secondary | ICD-10-CM

## 2020-02-10 NOTE — Therapy (Signed)
[place], [place], [address] Phone: [phone]   Fax:  [phone]  Physical Therapy Treatment  Patient Details  Name: Alicia Hunt MRN: [id number] Date of Birth: 12-21-1955 Referring Provider (PT): Hulan Saas, DO   Encounter Date: 02/10/2020   PT End of Session - 02/10/20 1100    Visit Number 6    Number of Visits 12    Date for PT Re-Evaluation 03/06/20    Authorization Type [employer] Medicare, progress note by visit 10    PT Start Time 1100    PT Stop Time 1150    PT Time Calculation (min) 50 min           Past Medical History:  Diagnosis Date  . Anxiety   . CPAP (continuous positive airway pressure) dependence   . Depression   . Diverticulitis   . Hyperlipidemia   . Hypertension   . Sleep apnea   . Thyroid disease     Past Surgical History:  Procedure Laterality Date  . ABDOMINAL HYSTERECTOMY    . BREAST EXCISIONAL BIOPSY Left 20+ years   pt states dr didnt remove lump  . CHOLECYSTECTOMY      There were no vitals filed for this visit.   Subjective Assessment - 02/10/20 1105    Subjective Right shoulder pain 3/10 pre-tx. Overall shoulder starting to feel a little better with less pain but still with limitations for reaching and lifting.    Currently in Pain? Yes    Pain Location Shoulder    Pain Orientation Right    Pain Descriptors / Indicators Aching    Pain Type Acute pain    Pain Onset More than a month ago    Pain Frequency Constant    Aggravating Factors  reaching, shoulder motion    Pain Relieving Factors medication and heat    Effect of Pain on Daily Activities limits ability recahing and lifting activities              [facility] PT Assessment - 02/10/20 0001      Observation/Other Assessments   Focus on Therapeutic Outcomes (FOTO)  46% limited      AROM   Right Shoulder Internal Rotation --   reach to gluteal                         [facility] Adult PT Treatment/Exercise - 02/10/20 0001      Elbow Exercises   Elbow Flexion AROM;Right;20 reps;Seated    Bar Weights/Barbell (Elbow Flexion) 2 lbs      Shoulder Exercises: Supine   Flexion Limitations short arc flexion AROM "punch" partial AROM x 15 reps      Shoulder Exercises: Sidelying   External Rotation AROM;Right;15 reps      Shoulder Exercises: Standing   Other Standing Exercises pendulums holding 2 lb. weight x 1 min, standing flexion and scaption AAROM with P-ball roll on high plinth 2x10 ea. with 65 cm ball    Other Standing Exercises standing "saw" row right side 2x10, UE ranger AAROM flexion x 15 reps, attempted scaption but held due to pain      Shoulder Exercises: Pulleys   Flexion 2 minutes    Scaption 2 minutes      Shoulder Exercises: Isometric Strengthening   Other Isometric Exercises shoulder isometricic in supine with therapist light resistance x 10 ea. abduction, ER, IR, adduction, seated self resisted flexion isometric using left hand in sitting  x 10 reps      Wrist Exercises   Wrist Flexion AROM;Strengthening;Right;20 reps;Seated    Bar Weights/Barbell (Wrist Flexion) 2 lbs    Wrist Extension AROM;Strengthening;Right;20 reps;Seated    Bar Weights/Barbell (Wrist Extension) 2 lbs      Cryotherapy   Number Minutes Cryotherapy 10 Minutes    Cryotherapy Location Shoulder    Type of Cryotherapy Ice pack      Manual Therapy   Passive ROM right shoulder all directions                    PT Short Term Goals - 02/08/20 1419      PT SHORT TERM GOAL #1   Title Independent with HEP    Baseline met for initial HEP, will update prn    Time 3    Period Weeks    Status Achieved      PT SHORT TERM GOAL #2   Title Increase right shoulder flexion and abduction AROM to at least 90 deg to work towards improved ability to use right UE for reaching ADLs and chores    Baseline 70 deg 02/08/20    Time 3    Period Weeks    Status On-going               PT Long Term Goals - 02/10/20 1107      PT LONG TERM GOAL #1   Title Increase right shoulder flexion and abduction AROM to 120 deg or greater and IR reach behind back to at least L4 or higher to improve ability for dressing, bathing/grooming and reaching for chores    Baseline flexion and abduction 70 deg, IR reach to gluteals    Time 6    Period Weeks    Status On-going      PT LONG TERM GOAL #2   Title Improve FOTO outcome measure score to 40% or less limitation due to shoulder    Baseline 46% limited    Time 6    Period Weeks    Status On-going      PT LONG TERM GOAL #3   Title Right shoulder strength at least grossly 4/5 or greater all planes of ROM to work toward right UE use for lifting for chores    Baseline MMTs not tested given recent s/p fracture but would rate flexion and abduction 3-/5 based on partial AROM against gravity    Time 6    Period Weeks    Status On-going      PT LONG TERM GOAL #4   Title Decrease sleep disturbance symptoms due to right shoulder pain at least 75% or greater from current status    Baseline limited tolerance supine and right sidelying positions    Time 6    Period Weeks    Status On-going      PT LONG TERM GOAL #5   Title Perform reaching ADLs and light chores at home as needed with right shoulder pain 3/10 or less    Baseline 3/10 pain intermittently higher    Time 6    Period Weeks    Status On-going                 Plan - 02/10/20 1113    Clinical Impression Statement Pt. making functional gains as evidenced by 18% FOTO score improvement from baseline status at evaluation but continues with ROM limitations with limited ability overhead reaching as well as difficulty reaching behind back with RUE. Continued  pain could be concerning for further intrinsic shoulder/rotator cuff issue but plan continue PT pending further progress and status at next MD follow up.    Personal Factors and Comorbidities Age     Examination-Activity Limitations Bathing;Dressing;Sleep;Lift;Hygiene/Grooming;Carry;Reach Overhead    Examination-Participation Restrictions Driving;Cleaning;Laundry;Meal Prep    Stability/Clinical Decision Making Stable/Uncomplicated    Clinical Decision Making Low    Rehab Potential Good    PT Treatment/Interventions ADLs/Self Care Home Management;Cryotherapy;Iontophoresis 55m/ml Dexamethasone;Moist Heat;Therapeutic exercise;Neuromuscular re-education;Manual techniques;Passive range of motion;Dry needling;Taping;Electrical Stimulation;Vasopneumatic Device    PT Next Visit Plan continue shoulder PROM/AAROM/AROM as tolerated, light isometrics, elbow and wrist ROM as needed    PT Home Exercise Plan Access code: [email or web address], supine self-assisted right shoulder flexion using left hand to assist, supine wand ER, scapular retraction, table slides    Consulted and Agree with Plan of Care Patient           Patient will benefit from skilled therapeutic intervention in order to improve the following deficits and impairments:  Pain, Impaired UE functional use, Decreased strength, Decreased range of motion, Hypomobility  Visit Diagnosis: Acute pain of right shoulder  Stiffness of right shoulder, not elsewhere classified  Muscle weakness (generalized)     Problem List Patient Active Problem List   Diagnosis Date Noted  . Mild concussion 01/05/2020  . Closed fracture of distal clavicle 01/05/2020  . Right shoulder pain 01/05/2020  . Head injury 01/05/2020  . Constipation 11/15/2019  . Right lower quadrant abdominal pain 11/15/2019  . Lumbar radiculopathy 11/01/2019  . Chest pain 09/13/2019  . Burning sensation of feet 09/13/2019  . OSA on CPAP 09/13/2019  . 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 Prediabetes 01/07/2018  . Hypertension 01/06/2018  . Hypothyroidism 01/06/2018  . Vitamin D deficiency 01/06/2018  . Low vitamin B12 level 01/06/2018  . Depression 01/06/2018  . Other  hyperlipidemia 01/06/2018  . H/O Diverticulitis of colon with bleeding 01/06/2018    CBeaulah Dinning PT, DPT 02/10/20 11:42 AM  CR[facility]CS[facility]GSt[address] [address]Phone: [phone]  Fax:  [phone] Name: ABetul BriskyMRN: [id number] of Birth: 604/04/1956

## 2020-02-14 ENCOUNTER — Other Ambulatory Visit: Payer: Self-pay

## 2020-02-14 ENCOUNTER — Encounter: Payer: Self-pay | Admitting: Physical Therapy

## 2020-02-14 ENCOUNTER — Ambulatory Visit: Payer: Medicare Other | Admitting: Physical Therapy

## 2020-02-14 DIAGNOSIS — M6281 Muscle weakness (generalized): Secondary | ICD-10-CM | POA: Diagnosis not present

## 2020-02-14 DIAGNOSIS — M25611 Stiffness of right shoulder, not elsewhere classified: Secondary | ICD-10-CM | POA: Diagnosis not present

## 2020-02-14 DIAGNOSIS — M25511 Pain in right shoulder: Secondary | ICD-10-CM

## 2020-02-14 NOTE — Patient Instructions (Signed)

## 2020-02-14 NOTE — Therapy (Signed)
[place], [place], [address] Phone: [phone]   Fax:  [phone]  Physical Therapy Treatment  Patient Details  Name: Alicia Hunt MRN: [id number] Date of Birth: 06-30-1955 Referring Provider (PT): Hulan Saas, DO   Encounter Date: 02/14/2020   PT End of Session - 02/14/20 1307    Visit Number 7    Number of Visits 12    Date for PT Re-Evaluation 03/06/20    Authorization Type [employer] Medicare, progress note by visit 10    PT Start Time 1146    PT Stop Time 1245   time spent for dry needling, electrical stimulation and ice pack not included in direct timed minutes   PT Time Calculation (min) 59 min    Activity Tolerance Patient limited by pain    Behavior During Therapy [facility] for tasks assessed/performed           Past Medical History:  Diagnosis Date  . Anxiety   . CPAP (continuous positive airway pressure) dependence   . Depression   . Diverticulitis   . Hyperlipidemia   . Hypertension   . Sleep apnea   . Thyroid disease     Past Surgical History:  Procedure Laterality Date  . ABDOMINAL HYSTERECTOMY    . BREAST EXCISIONAL BIOPSY Left 20+ years   pt states dr didnt remove lump  . CHOLECYSTECTOMY      There were no vitals filed for this visit.   Subjective Assessment - 02/14/20 1151    Subjective No major soreness after last tx. session last week but unfortunately pt. reports pain has been exacerbated since yesterday, no specific new exacerbating cause noted. Right shoulder pain 6/10 today including right glenohumeral region as well as upper trapezius and posterior shoulder/periscapular region.    Pertinent History LBP    Diagnostic tests X-rays, [place]    Patient Stated Goals return to activities such as gardening, playing with granchild, tennis    Currently in Pain? Yes    Pain Score 6     Pain Location Shoulder    Pain Orientation Right    Pain Descriptors / Indicators  Aching    Pain Type Acute pain    Pain Onset More than a month ago    Pain Frequency Constant    Aggravating Factors  reaching, shoulder motion    Pain Relieving Factors medication and heat    Effect of Pain on Daily Activities limited ability reaching ADLs and lifting activities with RUE use                             [facility] Adult PT Treatment/Exercise - 02/14/20 0001      Elbow Exercises   Elbow Flexion AROM;Right;20 reps;Supine    Bar Weights/Barbell (Elbow Flexion) 1 lb      Shoulder Exercises: Supine   Other Supine Exercises supine wand ER x 20 reps      Shoulder Exercises: Standing   Other Standing Exercises standing P-ball assisted flexion with 55 cm ball roll on high table x 20 reps    Other Standing Exercises pendulums holding 2 lb. weight x 2 min, scapular retractions x 15 reps      Shoulder Exercises: Pulleys   Flexion 2 minutes    Scaption Limitations held due to pain      Wrist Exercises   Wrist Flexion AROM;Strengthening;Right;20 reps;Seated    Bar Weights/Barbell (Wrist Flexion) 2 lbs  Wrist Extension AROM;Strengthening;Right;20 reps;Seated    Bar Weights/Barbell (Wrist Extension) 1 lb      Cryotherapy   Number Minutes Cryotherapy 10 Minutes    Cryotherapy Location Shoulder    Type of Cryotherapy Ice pack      Manual Therapy   Passive ROM right shoulder all directions            Trigger Point Dry Needling - 02/14/20 0001    Consent Given? Yes    Education Handout Provided Yes    Muscles Treated Head and Neck Upper trapezius    Muscles Treated Upper Quadrant Supraspinatus;Infraspinatus;Deltoid;Teres minor    Dry Needling Comments needling to right-sided muscles in left sidelying with 32 gauge 30 and 50 mm needles    Electrical Stimulation Performed with Dry Needling Yes    E-stim with Dry Needling Details TENS 20 pps x 10 minutes                PT Education - 02/14/20 1306    Education Details dry needling, MD follow up  for further assessment if pain exacerbation persists or if not improving with further therapy    Person(s) Educated Patient    Methods Explanation;Handout    Comprehension Verbalized understanding            PT Short Term Goals - 02/08/20 1419      PT SHORT TERM GOAL #1   Title Independent with HEP    Baseline met for initial HEP, will update prn    Time 3    Period Weeks    Status Achieved      PT SHORT TERM GOAL #2   Title Increase right shoulder flexion and abduction AROM to at least 90 deg to work towards improved ability to use right UE for reaching ADLs and chores    Baseline 70 deg 02/08/20    Time 3    Period Weeks    Status On-going             PT Long Term Goals - 02/10/20 1107      PT LONG TERM GOAL #1   Title Increase right shoulder flexion and abduction AROM to 120 deg or greater and IR reach behind back to at least L4 or higher to improve ability for dressing, bathing/grooming and reaching for chores    Baseline flexion and abduction 70 deg, IR reach to gluteals    Time 6    Period Weeks    Status On-going      PT LONG TERM GOAL #2   Title Improve FOTO outcome measure score to 40% or less limitation due to shoulder    Baseline 46% limited    Time 6    Period Weeks    Status On-going      PT LONG TERM GOAL #3   Title Right shoulder strength at least grossly 4/5 or greater all planes of ROM to work toward right UE use for lifting for chores    Baseline MMTs not tested given recent s/p fracture but would rate flexion and abduction 3-/5 based on partial AROM against gravity    Time 6    Period Weeks    Status On-going      PT LONG TERM GOAL #4   Title Decrease sleep disturbance symptoms due to right shoulder pain at least 75% or greater from current status    Baseline limited tolerance supine and right sidelying positions    Time 6    Period Weeks  Status On-going      PT LONG TERM GOAL #5   Title Perform reaching ADLs and light chores at home  as needed with right shoulder pain 3/10 or less    Baseline 3/10 pain intermittently higher    Time 6    Period Weeks    Status On-going                 Plan - 02/14/20 1307    Clinical Impression Statement Setback today due to pain exacerbation as noted in subjective with more limited exercise tolerance. More passive tx. focus given limited exercise tolerance with inclusion of trial dry needling to help with myofascial component of symptoms for upper trapezius and periscapular region pain. Still concern for underlying intrinsic shoulder issue/rotator cuff pathology given persistent pain but will continue to monitor status-if symptoms calm down and more improvement noted would plan continue PT but if exacerbation persists over the next 1-2 sessions would plan hold PT while pt. follows up with referring provider for further assessment.    Personal Factors and Comorbidities Age    Examination-Activity Limitations Bathing;Dressing;Sleep;Lift;Hygiene/Grooming;Carry;Reach Overhead    Examination-Participation Restrictions Driving;Cleaning;Laundry;Meal Prep    Stability/Clinical Decision Making Stable/Uncomplicated    Clinical Decision Making Low    PT Frequency 2x / week    PT Duration 6 weeks    PT Treatment/Interventions ADLs/Self Care Home Management;Cryotherapy;Iontophoresis 93m/ml Dexamethasone;Moist Heat;Therapeutic exercise;Neuromuscular re-education;Manual techniques;Passive range of motion;Dry needling;Taping;Electrical Stimulation;Vasopneumatic Device    PT Next Visit Plan check response to dry needling and monitor pain exacerbation as reported today, continue shoulder PROM/AAROM/AROM as tolerated, light isometrics, elbow and wrist ROM as needed    PT Home Exercise Plan Access code: [email or web address], supine self-assisted right shoulder flexion using left hand to assist, supine wand ER, scapular retraction, table slides    Consulted and Agree with Plan of Care Patient            Patient will benefit from skilled therapeutic intervention in order to improve the following deficits and impairments:  Pain, Impaired UE functional use, Decreased strength, Decreased range of motion, Hypomobility  Visit Diagnosis: Acute pain of right shoulder  Stiffness of right shoulder, not elsewhere classified  Muscle weakness (generalized)     Problem List Patient Active Problem List   Diagnosis Date Noted  . Mild concussion 01/05/2020  . Closed fracture of distal clavicle 01/05/2020  . Right shoulder pain 01/05/2020  . Head injury 01/05/2020  . Constipation 11/15/2019  . Right lower quadrant abdominal pain 11/15/2019  . Lumbar radiculopathy 11/01/2019  . Chest pain 09/13/2019  . Burning sensation of feet 09/13/2019  . OSA on CPAP 09/13/2019  . 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 Prediabetes 01/07/2018  . Hypertension 01/06/2018  . Hypothyroidism 01/06/2018  . Vitamin D deficiency 01/06/2018  . Low vitamin B12 level 01/06/2018  . Depression 01/06/2018  . Other hyperlipidemia 01/06/2018  . H/O Diverticulitis of colon with bleeding 01/06/2018    CBeaulah Dinning PT, DPT 02/14/20 1:13 PM  C[facility] WalkCH[facility] Inc[address]GMan[place] [address]Phone: [phone]  Fax:  [phone] Name: AAleia LaroccaMRN: [id number] of Birth: 6Jan 13, 1957

## 2020-02-16 ENCOUNTER — Encounter: Payer: Medicare Other | Admitting: Physical Therapy

## 2020-02-17 ENCOUNTER — Encounter: Payer: Self-pay | Admitting: Physical Therapy

## 2020-02-17 ENCOUNTER — Other Ambulatory Visit: Payer: Self-pay

## 2020-02-17 ENCOUNTER — Ambulatory Visit: Payer: Medicare Other | Attending: Family Medicine | Admitting: Physical Therapy

## 2020-02-17 ENCOUNTER — Telehealth: Payer: Self-pay | Admitting: Family Medicine

## 2020-02-17 DIAGNOSIS — M25611 Stiffness of right shoulder, not elsewhere classified: Secondary | ICD-10-CM | POA: Insufficient documentation

## 2020-02-17 DIAGNOSIS — M6281 Muscle weakness (generalized): Secondary | ICD-10-CM | POA: Diagnosis not present

## 2020-02-17 DIAGNOSIS — M25511 Pain in right shoulder: Secondary | ICD-10-CM | POA: Diagnosis not present

## 2020-02-17 DIAGNOSIS — H9313 Tinnitus, bilateral: Secondary | ICD-10-CM

## 2020-02-17 NOTE — Telephone Encounter (Signed)
Spoke with patient. She is not taking any of these medications. Dr. Katrinka Blazing would like for her to have MRI then. Order placed and patient given number to call. Recommended to go into ED if symptoms worsen. Patient voices understanding.

## 2020-02-17 NOTE — Therapy (Signed)
[place], [place], [address] Phone: [phone]   Fax:  [phone]  Physical Therapy Treatment  Patient Details  Name: Alicia Hunt MRN: [id number] Date of Birth: [birth year] Referring Provider (PT): Hulan Saas, DO   Encounter Date: 02/17/2020   PT End of Session - 02/17/20 1142    Visit Number 8    Number of Visits 12    Date for PT Re-Evaluation 03/06/20    Authorization Type [employer] Medicare, progress note by visit 10    PT Start Time 1055    PT Stop Time 1150    PT Time Calculation (min) 55 min    Activity Tolerance Patient limited by pain    Behavior During Therapy [facility] for tasks assessed/performed           Past Medical History:  Diagnosis Date  . Anxiety   . CPAP (continuous positive airway pressure) dependence   . Depression   . Diverticulitis   . Hyperlipidemia   . Hypertension   . Sleep apnea   . Thyroid disease     Past Surgical History:  Procedure Laterality Date  . ABDOMINAL HYSTERECTOMY    . BREAST EXCISIONAL BIOPSY Left 20+ years   pt states dr didnt remove lump  . CHOLECYSTECTOMY      There were no vitals filed for this visit.   Subjective Assessment - 02/17/20 1057    Subjective Pt. reports within 24 hours of last session had good relief from dry needling performed with less pain. Still with pain and limitations for reaching ability but improved from status earlier this week. Primary pain in right lateral shoulder region as well as upper trapezius.    Diagnostic tests X-rays, [place]    Patient Stated Goals return to activities such as gardening, playing with granchild, tennis    Currently in Pain? Yes    Pain Score 4     Pain Location Shoulder    Pain Orientation Right    Pain Descriptors / Indicators Aching    Pain Type Acute pain    Pain Onset More than a month ago    Pain Frequency Constant    Aggravating Factors  reaching, shoulder motion    Pain  Relieving Factors medication and heat    Effect of Pain on Daily Activities limits ability reaching ADLs and lifting activities              [facility] PT Assessment - 02/17/20 0001      AROM   Right Shoulder Flexion 85 Degrees                         [facility] Adult PT Treatment/Exercise - 02/17/20 0001      Shoulder Exercises: Sidelying   Internal Rotation AROM;Strengthening;Right;15 reps    Theraband Level (Shoulder Internal Rotation) Level 1 (Yellow)      Shoulder Exercises: Standing   Extension AROM;Strengthening;Both;15 reps    Theraband Level (Shoulder Extension) Level 1 (Yellow)    Retraction AROM;Strengthening;Both;15 reps    Theraband Level (Shoulder Retraction) Level 1 (Yellow)    Other Standing Exercises UE ranger AAROM for flexion 2x10      Shoulder Exercises: Pulleys   Flexion 2 minutes    Scaption 2 minutes      Shoulder Exercises: Isometric Strengthening   Flexion Limitations 10 reps x 3-5 sec holds with small ball    External Rotation Limitations 10 reps x 3-5 sec holds with  small ball    ABduction Limitations 10 reps x 3-5 sec holds with small ball      Cryotherapy   Number Minutes Cryotherapy 10 Minutes    Cryotherapy Location Shoulder    Type of Cryotherapy Ice pack      Manual Therapy   Passive ROM right shoulder all directions            Trigger Point Dry Needling - 02/17/20 0001    Consent Given? Yes    Muscles Treated Head and Neck Upper trapezius    Muscles Treated Upper Quadrant Infraspinatus;Deltoid   right anterior, middle and posterior deltoid   Dry Needling Comments needling in left sidelying with 30-32 gauge 30 and 50 mm needles    Electrical Stimulation Performed with Dry Needling Yes    E-stim with Dry Needling Details TENS 20 pps x 10 minutes                PT Education - 02/17/20 1142    Education Details exercises, POC    Person(s) Educated Patient    Methods Explanation;Demonstration;Verbal cues     Comprehension Verbalized understanding            PT Short Term Goals - 02/08/20 1419      PT SHORT TERM GOAL #1   Title Independent with HEP    Baseline met for initial HEP, will update prn    Time 3    Period Weeks    Status Achieved      PT SHORT TERM GOAL #2   Title Increase right shoulder flexion and abduction AROM to at least 90 deg to work towards improved ability to use right UE for reaching ADLs and chores    Baseline 70 deg 02/08/20    Time 3    Period Weeks    Status On-going             PT Long Term Goals - 02/10/20 1107      PT LONG TERM GOAL #1   Title Increase right shoulder flexion and abduction AROM to 120 deg or greater and IR reach behind back to at least L4 or higher to improve ability for dressing, bathing/grooming and reaching for chores    Baseline flexion and abduction 70 deg, IR reach to gluteals    Time 6    Period Weeks    Status On-going      PT LONG TERM GOAL #2   Title Improve FOTO outcome measure score to 40% or less limitation due to shoulder    Baseline 46% limited    Time 6    Period Weeks    Status On-going      PT LONG TERM GOAL #3   Title Right shoulder strength at least grossly 4/5 or greater all planes of ROM to work toward right UE use for lifting for chores    Baseline MMTs not tested given recent s/p fracture but would rate flexion and abduction 3-/5 based on partial AROM against gravity    Time 6    Period Weeks    Status On-going      PT LONG TERM GOAL #4   Title Decrease sleep disturbance symptoms due to right shoulder pain at least 75% or greater from current status    Baseline limited tolerance supine and right sidelying positions    Time 6    Period Weeks    Status On-going      PT LONG TERM GOAL #5   Title  Perform reaching ADLs and light chores at home as needed with right shoulder pain 3/10 or less    Baseline 3/10 pain intermittently higher    Time 6    Period Weeks    Status On-going                  Plan - 02/17/20 1143    Clinical Impression Statement Good response to last session with addition of dry needling to help address associated right shoulder muscle pain. Still with pain with reaching but 15 deg improvement in flexion AROM noted from last measures. Progress with therapy goals otherwise ongoing.    Personal Factors and Comorbidities Age    Examination-Activity Limitations Bathing;Dressing;Sleep;Lift;Hygiene/Grooming;Carry;Reach Overhead    Examination-Participation Restrictions Driving;Cleaning;Laundry;Meal Prep    Stability/Clinical Decision Making Stable/Uncomplicated    Clinical Decision Making Low    Rehab Potential Good    PT Frequency 2x / week    PT Duration 6 weeks    PT Treatment/Interventions ADLs/Self Care Home Management;Cryotherapy;Iontophoresis 66m/ml Dexamethasone;Moist Heat;Therapeutic exercise;Neuromuscular re-education;Manual techniques;Passive range of motion;Dry needling;Taping;Electrical Stimulation;Vasopneumatic Device    PT Next Visit Plan further dry needling prn, continue shoulder PROM/AAROM/AROM as tolerated, light isometrics, elbow and wrist ROM as needed    PT Home Exercise Plan Access code: [email or web address], supine self-assisted right shoulder flexion using left hand to assist, supine wand ER, scapular retraction, table slides    Consulted and Agree with Plan of Care Patient           Patient will benefit from skilled therapeutic intervention in order to improve the following deficits and impairments:  Pain, Impaired UE functional use, Decreased strength, Decreased range of motion, Hypomobility  Visit Diagnosis: Acute pain of right shoulder  Stiffness of right shoulder, not elsewhere classified  Muscle weakness (generalized)     Problem List Patient Active Problem List   Diagnosis Date Noted  . Mild concussion 01/05/2020  . Closed fracture of distal clavicle 01/05/2020  . Right shoulder pain 01/05/2020  . Head injury  01/05/2020  . Constipation 11/15/2019  . Right lower quadrant abdominal pain 11/15/2019  . Lumbar radiculopathy 11/01/2019  . Chest pain 09/13/2019  . Burning sensation of feet 09/13/2019  . OSA on CPAP 09/13/2019  . 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 Prediabetes 01/07/2018  . Hypertension 01/06/2018  . Hypothyroidism 01/06/2018  . Vitamin D deficiency 01/06/2018  . Low vitamin B12 level 01/06/2018  . Depression 01/06/2018  . Other hyperlipidemia 01/06/2018  . H/O Diverticulitis of colon with bleeding 01/06/2018    CBeaulah Dinning PT, DPT 02/17/20 11:46 AM  [facility]169 Beechwood DriveGCementon N[place] [address]Phone: [phone]  Fax:  [phone] Name: Alicia LillMRN: [id number] of Birth: 6Apr 19, 1957

## 2020-02-17 NOTE — Telephone Encounter (Signed)
Concussion patient, has had extreme ear ringing for the past 4 days. Wonders if this is concussion related and if you have any advise for her.

## 2020-02-17 NOTE — Telephone Encounter (Signed)
If any aspirin or allergy meds discontinue first and see if it improves after 5 days,  If worsening then maybe consider MRI  If headache becomes significant go to ER

## 2020-02-21 ENCOUNTER — Encounter: Payer: Self-pay | Admitting: Physical Therapy

## 2020-02-21 ENCOUNTER — Ambulatory Visit: Payer: Medicare Other | Admitting: Physical Therapy

## 2020-02-21 ENCOUNTER — Other Ambulatory Visit: Payer: Self-pay

## 2020-02-21 DIAGNOSIS — M6281 Muscle weakness (generalized): Secondary | ICD-10-CM

## 2020-02-21 DIAGNOSIS — M25511 Pain in right shoulder: Secondary | ICD-10-CM

## 2020-02-21 DIAGNOSIS — M25611 Stiffness of right shoulder, not elsewhere classified: Secondary | ICD-10-CM

## 2020-02-22 ENCOUNTER — Encounter: Payer: Self-pay | Admitting: Physical Therapy

## 2020-02-22 NOTE — Therapy (Signed)
[place], [place], [address] Phone: [phone]   Fax:  [phone]  Physical Therapy Treatment  Patient Details  Name: Alicia Hunt MRN: [id number] Date of Birth: [birth year] Referring Provider (PT): Hulan Saas, DO   Encounter Date: 02/21/2020   PT End of Session - 02/22/20 0830    Visit Number 9    Number of Visits 12    Date for PT Re-Evaluation 03/06/20    Authorization Type U[employer] Medicare, progress note by visit 10 progress note next visit    PT Start Time 1145    PT Stop Time 1228    PT Time Calculation (min) 43 min    Activity Tolerance Patient limited by pain    Behavior During Therapy [facility] for tasks assessed/performed           Past Medical History:  Diagnosis Date  . Anxiety   . CPAP (continuous positive airway pressure) dependence   . Depression   . Diverticulitis   . Hyperlipidemia   . Hypertension   . Sleep apnea   . Thyroid disease     Past Surgical History:  Procedure Laterality Date  . ABDOMINAL HYSTERECTOMY    . BREAST EXCISIONAL BIOPSY Left 20+ years   pt states dr didnt remove lump  . CHOLECYSTECTOMY      There were no vitals filed for this visit.   Subjective Assessment - 02/21/20 1151    Subjective Patient reported no real change from the needling last visit. >She feels like overall sheis tired from not sleeping well and medications. She got her covid shot on the opposite arm which made her other arm sore.    Pertinent History LBP    Limitations Lifting;House hold activities    Diagnostic tests X-rays, [place]    Patient Stated Goals return to activities such as gardening, playing with granchild, tennis    Currently in Pain? Yes    Pain Score 4     Pain Location Shoulder    Pain Orientation Right    Pain Descriptors / Indicators Aching    Pain Type Acute pain    Pain Onset More than a month ago    Pain Frequency Constant    Aggravating Factors   reaching    Pain Relieving Factors medication and heat    Effect of Pain on Daily Activities limits ability to reach                             [facility] Adult PT Treatment/Exercise - 02/22/20 0001      Shoulder Exercises: Seated   Other Seated Exercises seated with ue ranger reaching forward x20; side to side pain foree x15; coircles x15 each direction.       Shoulder Exercises: Sidelying   Internal Rotation AROM;Strengthening;Right;15 reps    Theraband Level (Shoulder Internal Rotation) Level 1 (Yellow)      Shoulder Exercises: Standing   Extension AROM;Strengthening;Both;20 reps    Theraband Level (Shoulder Extension) Level 1 (Yellow)    Retraction AROM;Strengthening;Both;15 reps    Theraband Level (Shoulder Retraction) Level 1 (Yellow)    Other Standing Exercises finger ladder x5 below eye level       Shoulder Exercises: Pulleys   Flexion 2 minutes    Scaption 2 minutes      Modalities   Modalities Cryotherapy;Iontophoresis      Iontophoresis   Type of Iontophoresis Dexamethasone    Location right  anterior shoulder     Dose 1cc     Time slow release       Manual Therapy   Joint Mobilization gentle posterior and inferior glides grade I and II     Soft tissue mobilization right posterior scapular region in left sidelying    Passive ROM right shoulder all directions                  PT Education - 02/22/20 0828    Education Details reviewed use of ipnto    Person(s) Educated Patient    Methods Explanation;Demonstration;Tactile cues;Verbal cues    Comprehension Verbalized understanding;Returned demonstration;Verbal cues required;Tactile cues required            PT Short Term Goals - 02/08/20 1419      PT SHORT TERM GOAL #1   Title Independent with HEP    Baseline met for initial HEP, will update prn    Time 3    Period Weeks    Status Achieved      PT SHORT TERM GOAL #2   Title Increase right shoulder flexion and abduction AROM to  at least 90 deg to work towards improved ability to use right UE for reaching ADLs and chores    Baseline 70 deg 02/08/20    Time 3    Period Weeks    Status On-going             PT Long Term Goals - 02/10/20 1107      PT LONG TERM GOAL #1   Title Increase right shoulder flexion and abduction AROM to 120 deg or greater and IR reach behind back to at least L4 or higher to improve ability for dressing, bathing/grooming and reaching for chores    Baseline flexion and abduction 70 deg, IR reach to gluteals    Time 6    Period Weeks    Status On-going      PT LONG TERM GOAL #2   Title Improve FOTO outcome measure score to 40% or less limitation due to shoulder    Baseline 46% limited    Time 6    Period Weeks    Status On-going      PT LONG TERM GOAL #3   Title Right shoulder strength at least grossly 4/5 or greater all planes of ROM to work toward right UE use for lifting for chores    Baseline MMTs not tested given recent s/p fracture but would rate flexion and abduction 3-/5 based on partial AROM against gravity    Time 6    Period Weeks    Status On-going      PT LONG TERM GOAL #4   Title Decrease sleep disturbance symptoms due to right shoulder pain at least 75% or greater from current status    Baseline limited tolerance supine and right sidelying positions    Time 6    Period Weeks    Status On-going      PT LONG TERM GOAL #5   Title Perform reaching ADLs and light chores at home as needed with right shoulder pain 3/10 or less    Baseline 3/10 pain intermittently higher    Time 6    Period Weeks    Status On-going                 Plan - 02/22/20 2449    Clinical Impression Statement Therapy focused on closed chain strengthening and strengthening below 90 degrees of flexion today.  With PROM most of her pain came beteween 1200-130 degrees fo florward flexion. Therapy avoided those areas. Her pain has decreased from a few visits ago. it is back to trending in  the right direction. Threrapy added ionto to further reduce inflammation. We will slowly progreess her back into over head strengtheing. We will also needle her shoulder next visit if needed. Her trigger ppoint in her trap was reucred today.  Therapy added seated suppted reaching in several planes.    Examination-Activity Limitations Bathing;Dressing;Sleep;Lift;Hygiene/Grooming;Carry;Reach Overhead    Examination-Participation Restrictions Driving;Cleaning;Laundry;Meal Prep    Stability/Clinical Decision Making Stable/Uncomplicated    Clinical Decision Making Low    Rehab Potential Good    PT Frequency 2x / week    PT Duration 6 weeks    PT Treatment/Interventions ADLs/Self Care Home Management;Cryotherapy;Iontophoresis 49m/ml Dexamethasone;Moist Heat;Therapeutic exercise;Neuromuscular re-education;Manual techniques;Passive range of motion;Dry needling;Taping;Electrical Stimulation;Vasopneumatic Device    PT Next Visit Plan further dry needling prn, continue shoulder PROM/AAROM/AROM as tolerated, light isometrics, elbow and wrist ROM as needed    PT Home Exercise Plan Access code: [email or web address], supine self-assisted right shoulder flexion using left hand to assist, supine wand ER, scapular retraction, table slides    Consulted and Agree with Plan of Care Patient           Patient will benefit from skilled therapeutic intervention in order to improve the following deficits and impairments:  Pain, Impaired UE functional use, Decreased strength, Decreased range of motion, Hypomobility  Visit Diagnosis: Acute pain of right shoulder  Stiffness of right shoulder, not elsewhere classified  Muscle weakness (generalized)     Problem List Patient Active Problem List   Diagnosis Date Noted  . Mild concussion 01/05/2020  . Closed fracture of distal clavicle 01/05/2020  . Right shoulder pain 01/05/2020  . Head injury 01/05/2020  . Constipation 11/15/2019  . Right lower quadrant abdominal  pain 11/15/2019  . Lumbar radiculopathy 11/01/2019  . Chest pain 09/13/2019  . Burning sensation of feet 09/13/2019  . OSA on CPAP 09/13/2019  . 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 Prediabetes 01/07/2018  . Hypertension 01/06/2018  . Hypothyroidism 01/06/2018  . Vitamin D deficiency 01/06/2018  . Low vitamin B12 level 01/06/2018  . Depression 01/06/2018  . Other hyperlipidemia 01/06/2018  . H/O Diverticulitis of colon with bleeding 01/06/2018    DCarney LivingPT DPT  02/22/2020, 9:08 AM  [facility] [address]Phone: [phone]  Fax:  [phone] Name: Alicia MasleyMRN: [id number] of Birth: 603-21-1957

## 2020-02-23 ENCOUNTER — Other Ambulatory Visit: Payer: Self-pay

## 2020-02-23 ENCOUNTER — Ambulatory Visit: Payer: Medicare Other | Admitting: Physical Therapy

## 2020-02-23 DIAGNOSIS — M6281 Muscle weakness (generalized): Secondary | ICD-10-CM

## 2020-02-23 DIAGNOSIS — M25611 Stiffness of right shoulder, not elsewhere classified: Secondary | ICD-10-CM

## 2020-02-23 DIAGNOSIS — M25511 Pain in right shoulder: Secondary | ICD-10-CM | POA: Diagnosis not present

## 2020-02-23 NOTE — Therapy (Signed)
[place], [place], [address] Phone: [phone]   Fax:  [phone]  Physical Therapy Treatment/Progress note   Patient Details  Name: Alicia Hunt MRN: [id number] Date of Birth: [birth year] Referring Provider (PT): Hulan Saas, DO   Encounter Date: 02/23/2020   Progress Note Reporting Period 01/24/2020 to 02/23/2020  See note below for Objective Data and Assessment of Progress/Goals.        PT End of Session - 02/23/20 1126    Visit Number 10    Number of Visits 12    Date for PT Re-Evaluation 03/06/20    Authorization Type UHC Medicare, progress note by visit 20 progress note next visit    PT Start Time 1055    PT Stop Time 1135    PT Time Calculation (min) 40 min           Past Medical History:  Diagnosis Date  . Anxiety   . CPAP (continuous positive airway pressure) dependence   . Depression   . Diverticulitis   . Hyperlipidemia   . Hypertension   . Sleep apnea   . Thyroid disease     Past Surgical History:  Procedure Laterality Date  . ABDOMINAL HYSTERECTOMY    . BREAST EXCISIONAL BIOPSY Left 20+ years   pt states dr didnt remove lump  . CHOLECYSTECTOMY      There were no vitals filed for this visit.       [facility] PT Assessment - 02/23/20 0001      AROM   Right Shoulder Flexion 75 Degrees      Strength   Overall Strength Comments not tested 2nd to pain                          [facility] Adult PT Treatment/Exercise - 02/23/20 0001      Self-Care   Self-Care Other Self-Care Comments    Other Self-Care Comments  reviewed how to sleep with a pillow for improved positioning.       Shoulder Exercises: Seated   Other Seated Exercises seated with ue ranger reaching forward x20; side to side pain foree x15; coircles x15 each direction.       Shoulder Exercises: Standing   Extension AROM;Strengthening;Both;20 reps    Theraband Level (Shoulder  Extension) Level 1 (Yellow)    Retraction AROM;Strengthening;Both;15 reps    Theraband Level (Shoulder Retraction) Level 1 (Yellow)    Other Standing Exercises pendulums: all directions cuing to use her hips       Iontophoresis   Type of Iontophoresis Dexamethasone    Location right anterior shoulder     Dose 1cc     Time slow release       Manual Therapy   Joint Mobilization gentle posterior and inferior glides grade I and II     Soft tissue mobilization right posterior scapular region in left sidelying    Passive ROM right shoulder all directions                    PT Short Term Goals - 02/23/20 1501      PT SHORT TERM GOAL #1   Title Independent with HEP    Baseline met for initial HEP, will update prn    Time 3    Period Weeks    Status Achieved    Target Date 03/06/20      PT SHORT TERM GOAL #2   Title  Increase right shoulder flexion and abduction AROM to at least 90 deg to work towards improved ability to use right UE for reaching ADLs and chores    Baseline 70 deg 02/08/20    Time 3    Period Weeks    Status On-going    Target Date 02/14/20             PT Long Term Goals - 02/23/20 1501      PT LONG TERM GOAL #1   Title Increase right shoulder flexion and abduction AROM to 120 deg or greater and IR reach behind back to at least L4 or higher to improve ability for dressing, bathing/grooming and reaching for chores    Baseline flexion and abduction 75 deg, IR reach to gluteals    Time 6    Period Weeks    Status On-going      PT LONG TERM GOAL #2   Title Improve FOTO outcome measure score to 40% or less limitation due to shoulder    Baseline 48% limited    Time 6    Period Weeks    Status On-going      PT LONG TERM GOAL #3   Title Right shoulder strength at least grossly 4/5 or greater all planes of ROM to work toward right UE use for lifting for chores    Baseline not measured 2nd to pain    Time 6    Period Weeks    Status On-going       PT LONG TERM GOAL #4   Title Decrease sleep disturbance symptoms due to right shoulder pain at least 75% or greater from current status    Baseline continues to sleep on her shoulder.                 Plan - 02/23/20 1127    Clinical Impression Statement Patient remains limited by pain. She has significant pain in the front of her shoulder andd into her bicpes. Therapy has been scalling back her exercises but she continues to have high levels of pain. Her FOTO socre and active flexion have decreased. She was advised to continue with light movement below 90 dgrees of shoulder flexion. Therapy continue with ionto to the anterior shoulder. She will have 2 more visits then return to her MD. She will then go to [place] for a few months.She has been sleeping on her shoulder. She was advised not to sleep on her shoulder if able. She was shown how to sleep with a pillow under her arm to keep her from rolling    Personal Factors and Comorbidities Age    Examination-Activity Limitations Bathing;Dressing;Sleep;Lift;Hygiene/Grooming;Carry;Reach Overhead    Examination-Participation Restrictions Driving;Cleaning;Laundry;Meal Prep    Stability/Clinical Decision Making Stable/Uncomplicated    Clinical Decision Making Low    Rehab Potential Good    PT Frequency 2x / week    PT Duration 6 weeks    PT Treatment/Interventions ADLs/Self Care Home Management;Cryotherapy;Iontophoresis 12m/ml Dexamethasone;Moist Heat;Therapeutic exercise;Neuromuscular re-education;Manual techniques;Passive range of motion;Dry needling;Taping;Electrical Stimulation;Vasopneumatic Device    PT Next Visit Plan further dry needling prn, continue shoulder PROM/AAROM/AROM as tolerated, light isometrics, elbow and wrist ROM as needed; she may benefit from needling next visit.    PT Home Exercise Plan Access code: [email or web address], supine self-assisted right shoulder flexion using left hand to assist, supine wand ER, scapular retraction,  table slides    Consulted and Agree with Plan of Care Patient           Patient  will benefit from skilled therapeutic intervention in order to improve the following deficits and impairments:  Pain, Impaired UE functional use, Decreased strength, Decreased range of motion, Hypomobility  Visit Diagnosis: Acute pain of right shoulder  Stiffness of right shoulder, not elsewhere classified  Muscle weakness (generalized)     Problem List Patient Active Problem List   Diagnosis Date Noted  . Mild concussion 01/05/2020  . Closed fracture of distal clavicle 01/05/2020  . Right shoulder pain 01/05/2020  . Head injury 01/05/2020  . Constipation 11/15/2019  . Right lower quadrant abdominal pain 11/15/2019  . Lumbar radiculopathy 11/01/2019  . Chest pain 09/13/2019  . Burning sensation of feet 09/13/2019  . OSA on CPAP 09/13/2019  . 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 Prediabetes 01/07/2018  . Hypertension 01/06/2018  . Hypothyroidism 01/06/2018  . Vitamin D deficiency 01/06/2018  . Low vitamin B12 level 01/06/2018  . Depression 01/06/2018  . Other hyperlipidemia 01/06/2018  . H/O Diverticulitis of colon with bleeding 01/06/2018    Carney Living 02/23/2020, 3:06 PM  [facility] [address] [place], [place], [address] Phone: [phone]   Fax:  [phone]  Name: Alicia Hunt MRN: [id number] Date of Birth: [birth year]

## 2020-02-25 ENCOUNTER — Other Ambulatory Visit: Payer: Self-pay | Admitting: Internal Medicine

## 2020-02-28 ENCOUNTER — Ambulatory Visit: Payer: Medicare Other | Admitting: Physical Therapy

## 2020-02-28 ENCOUNTER — Other Ambulatory Visit: Payer: Self-pay

## 2020-02-28 DIAGNOSIS — M25511 Pain in right shoulder: Secondary | ICD-10-CM

## 2020-02-28 DIAGNOSIS — M6281 Muscle weakness (generalized): Secondary | ICD-10-CM | POA: Diagnosis not present

## 2020-02-28 DIAGNOSIS — M25611 Stiffness of right shoulder, not elsewhere classified: Secondary | ICD-10-CM

## 2020-02-28 NOTE — Therapy (Addendum)
[place], [place], [address] Phone: [phone]   Fax:  [phone]  Physical Therapy Treatment/Discharge   Patient Details  Name: Alicia Hunt MRN: [id number] Date of Birth: May 28, 1955 Referring Provider (PT): Hulan Saas, DO   Encounter Date: 02/28/2020   PT End of Session - 02/28/20 1150    Visit Number 11    Number of Visits 12    Date for PT Re-Evaluation 03/06/20    Authorization Type UHC Medicare, progress note by visit 20 progress note next visit    PT Start Time 1059    PT Stop Time 1144    PT Time Calculation (min) 45 min    Activity Tolerance Patient limited by pain    Behavior During Therapy [facility] for tasks assessed/performed           Past Medical History:  Diagnosis Date  . Anxiety   . CPAP (continuous positive airway pressure) dependence   . Depression   . Diverticulitis   . Hyperlipidemia   . Hypertension   . Sleep apnea   . Thyroid disease     Past Surgical History:  Procedure Laterality Date  . ABDOMINAL HYSTERECTOMY    . BREAST EXCISIONAL BIOPSY Left 20+ years   pt states dr didnt remove lump  . CHOLECYSTECTOMY      There were no vitals filed for this visit.   Subjective Assessment - 02/28/20 1057    Subjective Pt. reports doing better today compared with last session and felt ionto patch was helpful. Primary pain still in right anterior shoulder region 2-3/10 with some radiating into right proximal arm.    Currently in Pain? Yes    Pain Score --   2-3   Pain Location Shoulder    Pain Orientation Right    Pain Descriptors / Indicators Throbbing    Pain Type Acute pain    Pain Onset More than a month ago    Pain Frequency Constant    Aggravating Factors  reaching, activity    Pain Relieving Factors medication    Effect of Pain on Daily Activities limits ability to reach                             [facility] Adult PT  Treatment/Exercise - 02/28/20 0001      Shoulder Exercises: Supine   Other Supine Exercises HEP review: supine wand ER, table slides, progression supine AAROM, pendulums      Iontophoresis   Type of Iontophoresis Dexamethasone    Location right anterior shoulder     Dose 1cc     Time slow release       Manual Therapy   Soft tissue mobilization right posterior scapular region in left sidelying    Passive ROM right shoulder all directions            Trigger Point Dry Needling - 02/28/20 0001    Consent Given? Yes    Muscles Treated Head and Neck Upper trapezius    Muscles Treated Upper Quadrant Infraspinatus;Deltoid    Dry Needling Comments needling in left sidelying with 30-32 gauge 30 and 50 mm needles    Electrical Stimulation Performed with Dry Needling Yes    E-stim with Dry Needling Details TENS 20 pps x 10 minutes                PT Education - 02/28/20 1148    Education Details POC,  HEP review    Person(s) Educated Patient    Methods Explanation    Comprehension Verbalized understanding            PT Short Term Goals - 02/23/20 1501      PT SHORT TERM GOAL #1   Title Independent with HEP    Baseline met for initial HEP, will update prn    Time 3    Period Weeks    Status Achieved    Target Date 03/06/20      PT SHORT TERM GOAL #2   Title Increase right shoulder flexion and abduction AROM to at least 90 deg to work towards improved ability to use right UE for reaching ADLs and chores    Baseline 70 deg 02/08/20    Time 3    Period Weeks    Status On-going    Target Date 02/14/20             PT Long Term Goals - 02/23/20 1501      PT LONG TERM GOAL #1   Title Increase right shoulder flexion and abduction AROM to 120 deg or greater and IR reach behind back to at least L4 or higher to improve ability for dressing, bathing/grooming and reaching for chores    Baseline flexion and abduction 75 deg, IR reach to gluteals    Time 6    Period Weeks      Status On-going      PT LONG TERM GOAL #2   Title Improve FOTO outcome measure score to 40% or less limitation due to shoulder    Baseline 48% limited    Time 6    Period Weeks    Status On-going      PT LONG TERM GOAL #3   Title Right shoulder strength at least grossly 4/5 or greater all planes of ROM to work toward right UE use for lifting for chores    Baseline not measured 2nd to pain    Time 6    Period Weeks    Status On-going      PT LONG TERM GOAL #4   Title Decrease sleep disturbance symptoms due to right shoulder pain at least 75% or greater from current status    Baseline continues to sleep on her shoulder.                 Plan - 02/28/20 1114    Clinical Impression Statement Mild improvement from status last visit but overall continues with pain and limited right shoulder AROM due to pain and weakness so concern for underlying rotator cuff/intrinsic joint issue. Plan at this point will be for pt. to follow up with Dr. Tamala Julian for further assessment.    Personal Factors and Comorbidities Age    Examination-Activity Limitations Bathing;Dressing;Sleep;Lift;Hygiene/Grooming;Carry;Reach Overhead    Examination-Participation Restrictions Driving;Cleaning;Laundry;Meal Prep    Stability/Clinical Decision Making Stable/Uncomplicated    Clinical Decision Making Low    Rehab Potential Good    PT Frequency 2x / week    PT Duration 6 weeks    PT Treatment/Interventions ADLs/Self Care Home Management;Cryotherapy;Iontophoresis 20m/ml Dexamethasone;Moist Heat;Therapeutic exercise;Neuromuscular re-education;Manual techniques;Passive range of motion;Dry needling;Taping;Electrical Stimulation;Vasopneumatic Device    PT Next Visit Plan Follow up with Dr. STamala Julianfor further assessment    PT Home Exercise Plan Access code: [email or web address], supine self-assisted right shoulder flexion using left hand to assist, supine wand ER, scapular retraction, table slides    Consulted and Agree  with Plan of Care Patient  Patient will benefit from skilled therapeutic intervention in order to improve the following deficits and impairments:  Pain, Impaired UE functional use, Decreased strength, Decreased range of motion, Hypomobility  Visit Diagnosis: Acute pain of right shoulder  Stiffness of right shoulder, not elsewhere classified  Muscle weakness (generalized)  PHYSICAL THERAPY DISCHARGE SUMMARY  Visits from Start of Care: 11  Current functional level related to goals / functional outcomes: Improved motion and strength but still limited   Remaining deficits: Pain with all shoulder movement    Education / Equipment: HEP   Plan: Patient agrees to discharge.  Patient goals were partially met. Patient is being discharged due to the patient's request.  ?????       Problem List Patient Active Problem List   Diagnosis Date Noted  . Mild concussion 01/05/2020  . Closed fracture of distal clavicle 01/05/2020  . Right shoulder pain 01/05/2020  . Head injury 01/05/2020  . Constipation 11/15/2019  . Right lower quadrant abdominal pain 11/15/2019  . Lumbar radiculopathy 11/01/2019  . Chest pain 09/13/2019  . Burning sensation of feet 09/13/2019  . OSA on CPAP 09/13/2019  . 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 Prediabetes 01/07/2018  . Hypertension 01/06/2018  . Hypothyroidism 01/06/2018  . Vitamin D deficiency 01/06/2018  . Low vitamin B12 level 01/06/2018  . Depression 01/06/2018  . Other hyperlipidemia 01/06/2018  . H/O Diverticulitis of colon with bleeding 01/06/2018   Carolyne Littles PT DPT  04/24/2020  Beaulah Dinning, PT, DPT 02/28/20 11:53 AM  [facility] [address] [place], [place], [address] Phone: [phone]   Fax:  [phone]  Name: Abiola Behring MRN: [id number] Date of Birth: [birth year]

## 2020-03-01 ENCOUNTER — Ambulatory Visit: Payer: Medicare Other | Admitting: Physical Therapy

## 2020-03-05 NOTE — Progress Notes (Signed)
Subjective:    Patient ID: Alicia Hunt, female    DOB: 1955/09/09, 64 y.o.   MRN: [id number]  HPI The patient is here for follow up of their chronic medical problems, including htn, hypothyroidism, hyperlipidemia, prediabetes, depression, vitamin d and B12 def.  Doing some walking.  Has been doing PT for her shoulder.    She is eating well.    She takes ultracet daily.  This helps with her pain.     She will be going to [place] for the winder in a few days.   Medications and allergies reviewed with patient and updated if appropriate.  Patient Active Problem List   Diagnosis Date Noted  . Mild concussion 01/05/2020  . Closed fracture of distal clavicle 01/05/2020  . Right shoulder pain 01/05/2020  . Head injury 01/05/2020  . Constipation 11/15/2019  . Right lower quadrant abdominal pain 11/15/2019  . Lumbar radiculopathy 11/01/2019  . Chest pain 09/13/2019  . Burning sensation of feet 09/13/2019  . OSA on CPAP 09/13/2019  . 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 Prediabetes 01/07/2018  . Hypertension 01/06/2018  . Hypothyroidism 01/06/2018  . Vitamin D deficiency 01/06/2018  . Low vitamin B12 level 01/06/2018  . Depression 01/06/2018  . Other hyperlipidemia 01/06/2018  . H/O Diverticulitis of colon with bleeding 01/06/2018    Current Outpatient Medications on File Prior to Visit  Medication Sig Dispense Refill  . amLODipine-olmesartan (AZOR) 5-40 MG tablet TAKE 1 TABLET BY MOUTH  DAILY 90 tablet 3  . atorvastatin (LIPITOR) 10 MG tablet TAKE 1 TABLET BY MOUTH  DAILY 90 tablet 3  . escitalopram (LEXAPRO) 10 MG tablet TAKE 1 TABLET BY MOUTH  DAILY 90 tablet 3  . levothyroxine (SYNTHROID) 50 MCG tablet TAKE 1 TABLET BY MOUTH  DAILY 90 tablet 3  . Multiple Vitamin (MULTIVITAMIN) capsule Take 1 capsule by mouth daily.    . traMADol-acetaminophen (ULTRACET) 37.5-325 MG tablet Take 1 tablet by mouth every 6 (six) hours as needed.    . Vitamin D, Ergocalciferol, (DRISDOL)  1.25 MG (50000 UNIT) CAPS capsule Take 1 capsule (50,000 Units total) by mouth every 7 (seven) days. 12 capsule 0   No current facility-administered medications on file prior to visit.    Past Medical History:  Diagnosis Date  . Anxiety   . CPAP (continuous positive airway pressure) dependence   . Depression   . Diverticulitis   . Hyperlipidemia   . Hypertension   . Sleep apnea   . Thyroid disease     Past Surgical History:  Procedure Laterality Date  . ABDOMINAL HYSTERECTOMY    . BREAST EXCISIONAL BIOPSY Left 20+ years   pt states dr didnt remove lump  . CHOLECYSTECTOMY      Social History   Socioeconomic History  . Marital status: Married    Spouse name: Not on file  . Number of children: 2  . Years of education: Not on file  . Highest education level: Not on file  Occupational History  . Not on file  Tobacco Use  . Smoking status: Never Smoker  . Smokeless tobacco: Never Used  Substance and Sexual Activity  . Alcohol use: Yes  . Drug use: Never  . Sexual activity: Not on file  Other Topics Concern  . Not on file  Social History Narrative  . Not on file   Social Determinants of Health   Financial Resource Strain:   . Difficulty of Paying Living Expenses: Not on file  Food Insecurity:   .  Worried About Programme researcher, broadcasting/film/video in the Last Year: Not on file  . Ran Out of Food in the Last Year: Not on file  Transportation Needs:   . Lack of Transportation (Medical): Not on file  . Lack of Transportation (Non-Medical): Not on file  Physical Activity:   . Days of Exercise per Week: Not on file  . Minutes of Exercise per Session: Not on file  Stress:   . Feeling of Stress : Not on file  Social Connections:   . Frequency of Communication with Friends and Family: Not on file  . Frequency of Social Gatherings with Friends and Family: Not on file  . Attends Religious Services: Not on file  . Active Member of Clubs or Organizations: Not on file  . Attends Tax inspector Meetings: Not on file  . Marital Status: Not on file    Family History  Problem Relation Age of Onset  . Stroke Mother 79       cause of death    Review of Systems  Constitutional: Negative for chills and fever.  HENT: Positive for hearing loss and tinnitus (x 3 weeks). Negative for ear pain (left ear pressure ).        Some TMJ  Respiratory: Negative for cough, shortness of breath and wheezing.   Cardiovascular: Negative for chest pain, palpitations and leg swelling.  Neurological: Negative for dizziness, light-headedness and headaches.       Objective:   Vitals:   03/06/20 1107  BP: 126/78  Pulse: 66  Temp: 98.1 F (36.7 C)  SpO2: 96%   BP Readings from Last 3 Encounters:  03/06/20 126/78  02/07/20 140/88  01/11/20 110/88   Wt Readings from Last 3 Encounters:  03/06/20 211 lb (95.7 kg)  02/07/20 211 lb (95.7 kg)  01/11/20 206 lb (93.4 kg)   Body mass index is 36.22 kg/m.   Physical Exam    Constitutional: Appears well-developed and well-nourished. No distress.  HENT:  Head: Normocephalic and atraumatic.  Neck: Neck supple. No tracheal deviation present. No thyromegaly present.  No cervical lymphadenopathy Cardiovascular: Normal rate, regular rhythm and normal heart sounds.   No murmur heard. No carotid bruit .  No edema Pulmonary/Chest: Effort normal and breath sounds normal. No respiratory distress. No has no wheezes. No rales.  Skin: Skin is warm and dry. Not diaphoretic.  Psychiatric: Normal mood and affect. Behavior is normal.      Assessment & Plan:    See Problem List for Assessment and Plan of chronic medical problems.    This visit occurred during the SARS-CoV-2 public health emergency.  Safety protocols were in place, including screening questions prior to the visit, additional usage of staff PPE, and extensive cleaning of exam room while observing appropriate contact time as indicated for disinfecting solutions.

## 2020-03-05 NOTE — Patient Instructions (Addendum)
  Blood work was ordered.    Flu immunization administered today.      Medications reviewed and updated.  Changes include :   none     Please followup in 6 months   

## 2020-03-06 ENCOUNTER — Ambulatory Visit (INDEPENDENT_AMBULATORY_CARE_PROVIDER_SITE_OTHER): Payer: Medicare Other | Admitting: Internal Medicine

## 2020-03-06 ENCOUNTER — Other Ambulatory Visit: Payer: Self-pay

## 2020-03-06 ENCOUNTER — Encounter: Payer: Self-pay | Admitting: Internal Medicine

## 2020-03-06 ENCOUNTER — Ambulatory Visit: Payer: Medicare Other | Admitting: Family Medicine

## 2020-03-06 ENCOUNTER — Encounter: Payer: Self-pay | Admitting: Family Medicine

## 2020-03-06 VITALS — BP 140/80 | HR 60 | Ht 64.0 in | Wt 211.0 lb

## 2020-03-06 VITALS — BP 126/78 | HR 66 | Temp 98.1°F | Ht 64.0 in | Wt 211.0 lb

## 2020-03-06 DIAGNOSIS — M25511 Pain in right shoulder: Secondary | ICD-10-CM

## 2020-03-06 DIAGNOSIS — E538 Deficiency of other specified B group vitamins: Secondary | ICD-10-CM

## 2020-03-06 DIAGNOSIS — R7303 Prediabetes: Secondary | ICD-10-CM

## 2020-03-06 DIAGNOSIS — E7849 Other hyperlipidemia: Secondary | ICD-10-CM

## 2020-03-06 DIAGNOSIS — F3289 Other specified depressive episodes: Secondary | ICD-10-CM | POA: Diagnosis not present

## 2020-03-06 DIAGNOSIS — I10 Essential (primary) hypertension: Secondary | ICD-10-CM

## 2020-03-06 DIAGNOSIS — G8929 Other chronic pain: Secondary | ICD-10-CM | POA: Diagnosis not present

## 2020-03-06 DIAGNOSIS — E039 Hypothyroidism, unspecified: Secondary | ICD-10-CM

## 2020-03-06 DIAGNOSIS — E559 Vitamin D deficiency, unspecified: Secondary | ICD-10-CM | POA: Diagnosis not present

## 2020-03-06 DIAGNOSIS — Z23 Encounter for immunization: Secondary | ICD-10-CM | POA: Diagnosis not present

## 2020-03-06 LAB — CBC WITH DIFFERENTIAL/PLATELET
Basophils Absolute: 0 10*3/uL (ref 0.0–0.1)
Basophils Relative: 0.6 % (ref 0.0–3.0)
Eosinophils Absolute: 0.1 10*3/uL (ref 0.0–0.7)
Eosinophils Relative: 2.2 % (ref 0.0–5.0)
HCT: 41.8 % (ref 36.0–46.0)
Hemoglobin: 14 g/dL (ref 12.0–15.0)
Lymphocytes Relative: 32.3 % (ref 12.0–46.0)
Lymphs Abs: 2.1 10*3/uL (ref 0.7–4.0)
MCHC: 33.4 g/dL (ref 30.0–36.0)
MCV: 82.8 fl (ref 78.0–100.0)
Monocytes Absolute: 0.5 10*3/uL (ref 0.1–1.0)
Monocytes Relative: 7 % (ref 3.0–12.0)
Neutro Abs: 3.8 10*3/uL (ref 1.4–7.7)
Neutrophils Relative %: 57.9 % (ref 43.0–77.0)
Platelets: 220 10*3/uL (ref 150.0–400.0)
RBC: 5.06 Mil/uL (ref 3.87–5.11)
RDW: 13.9 % (ref 11.5–15.5)
WBC: 6.6 10*3/uL (ref 4.0–10.5)

## 2020-03-06 LAB — COMPREHENSIVE METABOLIC PANEL
ALT: 34 U/L (ref 0–35)
AST: 27 U/L (ref 0–37)
Albumin: 4 g/dL (ref 3.5–5.2)
Alkaline Phosphatase: 92 U/L (ref 39–117)
BUN: 12 mg/dL (ref 6–23)
CO2: 31 mEq/L (ref 19–32)
Calcium: 9.1 mg/dL (ref 8.4–10.5)
Chloride: 103 mEq/L (ref 96–112)
Creatinine, Ser: 0.75 mg/dL (ref 0.40–1.20)
GFR: 82.86 mL/min (ref >60.00)
Glucose, Bld: 107 mg/dL — ABNORMAL HIGH (ref 70–99)
Potassium: 3.8 mEq/L (ref 3.5–5.1)
Sodium: 140 mEq/L (ref 135–145)
Total Bilirubin: 1.3 mg/dL — ABNORMAL HIGH (ref 0.2–1.2)
Total Protein: 7.1 g/dL (ref 6.0–8.3)

## 2020-03-06 LAB — LIPID PANEL
Cholesterol: 137 mg/dL (ref 0–200)
HDL: 42.7 mg/dL (ref >39.00)
LDL Cholesterol: 56 mg/dL (ref 0–99)
NonHDL: 94.7
Total CHOL/HDL Ratio: 3
Triglycerides: 192 mg/dL — ABNORMAL HIGH (ref 0.0–149.0)
VLDL: 38.4 mg/dL (ref 0.0–40.0)

## 2020-03-06 LAB — VITAMIN B12: Vitamin B-12: 1099 pg/mL — ABNORMAL HIGH (ref 211–911)

## 2020-03-06 LAB — TSH: TSH: 4.22 u[IU]/mL (ref 0.35–4.50)

## 2020-03-06 LAB — VITAMIN D 25 HYDROXY (VIT D DEFICIENCY, FRACTURES): VITD: 29.47 ng/mL — ABNORMAL LOW (ref 30.00–100.00)

## 2020-03-06 LAB — HEMOGLOBIN A1C: Hgb A1c MFr Bld: 6.3 % (ref 4.6–6.5)

## 2020-03-06 NOTE — Assessment & Plan Note (Signed)
Chronic BP well controlled Continue amlodipine-olmesartan 5-4 mg daily cmp

## 2020-03-06 NOTE — Assessment & Plan Note (Signed)
Patient has done fairly remarkably well at this time.  Patient though on exam today does have some increasing weakness of the rotator cuff and a positive empty can which is a new finding.  I am concerned the patient may have more of a rotator cuff tear at this time.  Would encourage patient having an MRI.  Unfortunately at this time patient is going to be leaving the country for up to 4 months.  Patient does think she has made some progress with physical therapy and encouraged her to continue to do so in [place] but also we will try to get the MRI before she leaves.  I did warn her that there is a chance for any type of surgical intervention that would be necessary.  We did discuss the potential for injections which patient also declined at this time.  Continue the vitamin D supplementation.  Icing regimen.  Treatment options will be based on imaging.

## 2020-03-06 NOTE — Patient Instructions (Signed)
Good to see you MRI shoulder Alicia Hunt We will try to get MRI done before you leave Worse case scenario we will get it done in [place] Continue PT if MRI delayed See me again when you come back

## 2020-03-06 NOTE — Assessment & Plan Note (Signed)
Chronic Check a1c Low sugar / carb diet Stressed regular exercise Will work on weight loss  

## 2020-03-06 NOTE — Progress Notes (Signed)
[facility] [address] [place] [address] Phone: [phone] Subjective:   I Alicia Hunt am serving as a Neurosurgeon for Dr. Antoine Primas.  This visit occurred during the SARS-CoV-2 public health emergency.  Safety protocols were in place, including screening questions prior to the visit, additional usage of staff PPE, and extensive cleaning of exam room while observing appropriate contact time as indicated for disinfecting solutions.   I'm seeing this patient by the request  of:  Pincus Sanes, MD  CC: Shoulder pain follow-up  WEX:[id number]   02/07/2020 Patient appears to be healing just fine.  There was a nondisplaced fracture and she will do just okay with the physical therapy.  Patient still does have a lot of significant voluntary guarding.  We discussed the potential of an MRI.  Patient wants to hold at this point because she would not consider the surgical intervention as important at this time.  Patient is also ideally wanting to go back to [place] at the end of next month.  I would like to see her again in 4 weeks to further evaluate and at that time we will discuss the possibility of injection, medications, and then advanced imaging if warranted.   Update 03/06/2020 Alicia Hunt is a 64 y.o. female coming in with complaint of right shoulder pain. Patient states she still has a lot of pain. Finished PT recently. Going to [place] on Sunday and would like to continue PT there. States she has made some progress and her ROM has increased.  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  Still having some nighttime pain as well.  Does feel like it has made improvement though.       Past Medical History:  Diagnosis Date  . Anxiety   . CPAP (continuous positive airway pressure) dependence   . Depression   . Diverticulitis   . Hyperlipidemia   . Hypertension   .  Sleep apnea   . Thyroid disease    Past Surgical History:  Procedure Laterality Date  . ABDOMINAL HYSTERECTOMY    . BREAST EXCISIONAL BIOPSY Left 20+ years   pt states dr didnt remove lump  . CHOLECYSTECTOMY     Social History   Socioeconomic History  . Marital status: Married    Spouse name: Not on file  . Number of children: 2  . Years of education: Not on file  . Highest education level: Not on file  Occupational History  . Not on file  Tobacco Use  . Smoking status: Never Smoker  . Smokeless tobacco: Never Used  Substance and Sexual Activity  . Alcohol use: Yes  . Drug use: Never  . Sexual activity: Not on file  Other Topics Concern  . Not on file  Social History Narrative  . Not on file   Social Determinants of Health   Financial Resource Strain:   . Difficulty of Paying Living Expenses: Not on file  Food Insecurity:   . Worried About Programme researcher, broadcasting/film/video in the Last Year: Not on file  . Ran Out of Food in the Last Year: Not on file  Transportation Needs:   . Lack of Transportation (Medical): Not on file  . Lack of Transportation (Non-Medical): Not on file  Physical Activity:   . Days of Exercise per Week: Not on file  . Minutes of Exercise per Session: Not on file  Stress:   . Feeling of Stress : Not on file  Social Connections:   . Frequency of Communication with Friends and Family: Not on file  . Frequency of Social Gatherings with Friends and Family: Not on file  . Attends Religious Services: Not on file  . Active Member of Clubs or Organizations: Not on file  . Attends Banker Meetings: Not on file  . Marital Status: Not on file   No Known Allergies Family History  Problem Relation Age of Onset  . Stroke Mother 53       cause of death    Current Outpatient Medications (Endocrine & Metabolic):  .  levothyroxine (SYNTHROID) 50 MCG tablet, TAKE 1 TABLET BY MOUTH  DAILY  Current Outpatient Medications (Cardiovascular):  .   amLODipine-olmesartan (AZOR) 5-40 MG tablet, TAKE 1 TABLET BY MOUTH  DAILY .  atorvastatin (LIPITOR) 10 MG tablet, TAKE 1 TABLET BY MOUTH  DAILY   Current Outpatient Medications (Analgesics):  .  traMADol-acetaminophen (ULTRACET) 37.5-325 MG tablet, Take 1 tablet by mouth every 6 (six) hours as needed.   Current Outpatient Medications (Other):  .  escitalopram (LEXAPRO) 10 MG tablet, TAKE 1 TABLET BY MOUTH  DAILY .  Multiple Vitamin (MULTIVITAMIN) capsule, Take 1 capsule by mouth daily. .  Vitamin D, Ergocalciferol, (DRISDOL) 1.25 MG (50000 UNIT) CAPS capsule, Take 1 capsule (50,000 Units total) by mouth every 7 (seven) days.   Reviewed prior external information including notes and imaging from  primary care provider As well as notes that were available from care everywhere and other healthcare systems.  Past medical history, social, surgical and family history all reviewed in electronic medical record.  No pertanent information unless stated regarding to the chief complaint.   Review of Systems:  No headache, visual changes, nausea, vomiting, diarrhea, constipation, dizziness, abdominal pain, skin rash, fevers, chills, night sweats, weight loss, swollen lymph nodes, body aches, joint swelling, chest pain, shortness of breath, mood changes. POSITIVE muscle aches  Objective  Blood pressure 140/80, pulse 60, height 5\' 4"  (1.626 m), weight 211 lb (95.7 kg), SpO2 98 %.   General: No apparent distress alert and oriented x3 mood and affect normal, dressed appropriately.  HEENT: Pupils equal, extraocular movements intact  Respiratory: Patient's speak in full sentences and does not appear short of breath  Cardiovascular: No lower extremity edema, non tender, no erythema  MSK: Right shoulder still has some significant amount of voluntary guarding.  Passively does have near full range of motion but actively once again guarding.  Rotator cuff strength belly does feel little weaker than previous  exam.  Patient has good strength of the subscapularis as well as the infraspinatus.  Weakness noted of the supraspinatus with empty can.    Impression and Recommendations:     The above documentation has been reviewed and is accurate and complete , DO

## 2020-03-06 NOTE — Addendum Note (Signed)
Addended by: Karma Ganja on: 03/06/2020 04:41 PM   Modules accepted: Orders

## 2020-03-06 NOTE — Assessment & Plan Note (Signed)
Chronic Check vitamin b12 daily

## 2020-03-06 NOTE — Assessment & Plan Note (Signed)
Chronic Controlled, stable Continue lexparo 10 mg daily  

## 2020-03-06 NOTE — Addendum Note (Signed)
Addended by: Edwena Felty T on: 03/06/2020 03:10 PM   Modules accepted: Orders

## 2020-03-06 NOTE — Assessment & Plan Note (Signed)
Chronic Taking vitamin D daily Check vitamin D level  

## 2020-03-06 NOTE — Assessment & Plan Note (Signed)
Chronic Check lipid panel  Continue atorvastatin 10 mg daily Regular exercise and healthy diet encouraged  

## 2020-03-06 NOTE — Assessment & Plan Note (Signed)
Chronic  Clinically euthyroid Currently taking levothyroxine 50 mcg Check tsh  Titrate med dose if needed  

## 2020-03-07 ENCOUNTER — Ambulatory Visit: Payer: Medicare Other | Admitting: Internal Medicine

## 2020-03-09 DIAGNOSIS — Z1159 Encounter for screening for other viral diseases: Secondary | ICD-10-CM | POA: Diagnosis not present

## 2020-03-10 ENCOUNTER — Other Ambulatory Visit: Payer: Self-pay

## 2020-03-10 ENCOUNTER — Ambulatory Visit (INDEPENDENT_AMBULATORY_CARE_PROVIDER_SITE_OTHER): Payer: Medicare Other

## 2020-03-10 DIAGNOSIS — M7521 Bicipital tendinitis, right shoulder: Secondary | ICD-10-CM | POA: Diagnosis not present

## 2020-03-10 DIAGNOSIS — M7551 Bursitis of right shoulder: Secondary | ICD-10-CM

## 2020-03-10 DIAGNOSIS — G8929 Other chronic pain: Secondary | ICD-10-CM

## 2020-03-10 DIAGNOSIS — M75111 Incomplete rotator cuff tear or rupture of right shoulder, not specified as traumatic: Secondary | ICD-10-CM | POA: Diagnosis not present

## 2020-03-10 DIAGNOSIS — M19011 Primary osteoarthritis, right shoulder: Secondary | ICD-10-CM | POA: Diagnosis not present

## 2020-03-14 ENCOUNTER — Ambulatory Visit: Payer: Medicare Other | Admitting: Internal Medicine

## 2020-05-01 ENCOUNTER — Telehealth: Payer: Self-pay | Admitting: Internal Medicine

## 2020-05-01 NOTE — Progress Notes (Signed)
  Chronic Care Management   Outreach Note  05/01/2020 Name: Ruth Kovich MRN: [id number] DOB: 31-Jan-1956  Referred by: Pincus Sanes, MD Reason for referral : No chief complaint on file.   An unsuccessful telephone outreach was attempted today. The patient was referred to the pharmacist for assistance with care management and care coordination.   Follow Up Plan:   Carley Perdue [employer] Scheduler

## 2020-05-07 ENCOUNTER — Telehealth: Payer: Self-pay | Admitting: Internal Medicine

## 2020-05-07 NOTE — Progress Notes (Signed)
  Chronic Care Management   Outreach Note  05/07/2020 Name: Zarriah Starkel MRN: [id number] DOB: 07/03/1955  Referred by: Pincus Sanes, MD Reason for referral : No chief complaint on file.   A second unsuccessful telephone outreach was attempted today. The patient was referred to pharmacist for assistance with care management and care coordination.  Follow Up Plan:   Carley Perdue [employer] Scheduler

## 2020-06-08 ENCOUNTER — Telehealth: Payer: Self-pay | Admitting: Internal Medicine

## 2020-06-08 NOTE — Progress Notes (Signed)
  Chronic Care Management   Outreach Note  06/08/2020 Name: Alicia Hunt MRN: [id number] DOB: 1955/05/21  Referred by: Pincus Sanes, MD Reason for referral : No chief complaint on file.   Third unsuccessful telephone outreach was attempted today. The patient was referred to the pharmacist for assistance with care management and care coordination.   Follow Up Plan:   Carley Perdue [employer] Scheduler

## 2020-09-01 ENCOUNTER — Other Ambulatory Visit: Payer: Self-pay | Admitting: Internal Medicine

## 2020-11-11 NOTE — Patient Instructions (Addendum)
    Blood work was ordered.      Medications changes include :   none     Please followup in 6 months  

## 2020-11-11 NOTE — Progress Notes (Signed)
Subjective:    Patient ID: Alicia Hunt, female    DOB: January 17, 1956, 65 y.o.   MRN: [id number]  HPI The patient is here for follow up of their chronic medical problems, including htn, hypothyroidism, hld, prediabetes, depression, vit d and b12 def  She is taking all of her medications as prescribed.    She is exercising regularly -  going to the pool. She is very active.  She is eating fairly well.    Medications and allergies reviewed with patient and updated if appropriate.  Patient Active Problem List   Diagnosis Date Noted   Mild concussion 01/05/2020   Closed fracture of distal clavicle 01/05/2020   Right shoulder pain 01/05/2020   Head injury 01/05/2020   Constipation 11/15/2019   Right lower quadrant abdominal pain 11/15/2019   Lumbar radiculopathy 11/01/2019   Chest pain 09/13/2019   Burning sensation of feet 09/13/2019   OSA on CPAP 09/13/2019   Hyperuricemia 11/22/2018   Prediabetes 01/07/2018   Hypertension 01/06/2018   Hypothyroidism 01/06/2018   Vitamin D deficiency 01/06/2018   Low vitamin B12 level 01/06/2018   Depression 01/06/2018   Other hyperlipidemia 01/06/2018   H/O Diverticulitis of colon with bleeding 01/06/2018    Current Outpatient Medications on File Prior to Visit  Medication Sig Dispense Refill   amLODipine-olmesartan (AZOR) 5-40 MG tablet TAKE 1 TABLET BY MOUTH  DAILY 90 tablet 3   atorvastatin (LIPITOR) 10 MG tablet TAKE 1 TABLET BY MOUTH  DAILY 90 tablet 0   escitalopram (LEXAPRO) 10 MG tablet TAKE 1 TABLET BY MOUTH  DAILY 90 tablet 3   levothyroxine (SYNTHROID) 50 MCG tablet TAKE 1 TABLET BY MOUTH  DAILY 90 tablet 3   Multiple Vitamin (MULTIVITAMIN) capsule Take 1 capsule by mouth daily.     traMADol-acetaminophen (ULTRACET) 37.5-325 MG tablet Take 1 tablet by mouth every 6 (six) hours as needed.     No current facility-administered medications on file prior to visit.    Past Medical History:  Diagnosis Date   Anxiety     CPAP (continuous positive airway pressure) dependence    Depression    Diverticulitis    Hyperlipidemia    Hypertension    Sleep apnea    Thyroid disease     Past Surgical History:  Procedure Laterality Date   ABDOMINAL HYSTERECTOMY     BREAST EXCISIONAL BIOPSY Left 20+ years   pt states dr didnt remove lump   CHOLECYSTECTOMY      Social History   Socioeconomic History   Marital status: Married    Spouse name: Not on file   Number of children: 2   Years of education: Not on file   Highest education level: Not on file  Occupational History   Not on file  Tobacco Use   Smoking status: Never   Smokeless tobacco: Never  Substance and Sexual Activity   Alcohol use: Yes   Drug use: Never   Sexual activity: Not on file  Other Topics Concern   Not on file  Social History Narrative   Not on file   Social Determinants of Health   Financial Resource Strain: Not on file  Food Insecurity: Not on file  Transportation Needs: Not on file  Physical Activity: Not on file  Stress: Not on file  Social Connections: Not on file    Family History  Problem Relation Age of Onset   Stroke Mother 68       cause of death  Review of Systems  Constitutional:  Negative for chills and fever.  Respiratory:  Negative for cough, shortness of breath and wheezing.   Cardiovascular:  Negative for chest pain, palpitations and leg swelling.  Gastrointestinal:  Positive for abdominal pain (occ left sided pain) and constipation (occ).       No gerd  Neurological:  Positive for headaches. Negative for light-headedness.      Objective:   Vitals:   11/12/20 1545  BP: 138/84  Pulse: 66  Temp: 98.6 F (37 C)  SpO2: 98%   BP Readings from Last 3 Encounters:  11/12/20 138/84  03/06/20 140/80  03/06/20 126/78   Wt Readings from Last 3 Encounters:  11/12/20 207 lb (93.9 kg)  03/06/20 211 lb (95.7 kg)  03/06/20 211 lb (95.7 kg)   Body mass index is 35.53 kg/m.   Physical  Exam    Constitutional: Appears well-developed and well-nourished. No distress.  HENT:  Head: Normocephalic and atraumatic.  Neck: Neck supple. No tracheal deviation present. No thyromegaly present.  No cervical lymphadenopathy Cardiovascular: Normal rate, regular rhythm and normal heart sounds.   No murmur heard. No carotid bruit .  No edema Pulmonary/Chest: Effort normal and breath sounds normal. No respiratory distress. No has no wheezes. No rales.  Abdomen; obese, NT, ND Skin: Skin is warm and dry. Not diaphoretic.  Psychiatric: Normal mood and affect. Behavior is normal.   Diabetic Foot Exam - Simple   Simple Foot Form Diabetic Foot exam was performed with the following findings: Yes   Visual Inspection No deformities, no ulcerations, no other skin breakdown bilaterally: Yes Sensation Testing Intact to touch and monofilament testing bilaterally: Yes Pulse Check Posterior Tibialis and Dorsalis pulse intact bilaterally: Yes Comments       Assessment & Plan:    See Problem List for Assessment and Plan of chronic medical problems.    This visit occurred during the SARS-CoV-2 public health emergency.  Safety protocols were in place, including screening questions prior to the visit, additional usage of staff PPE, and extensive cleaning of exam room while observing appropriate contact time as indicated for disinfecting solutions.

## 2020-11-12 ENCOUNTER — Encounter: Payer: Self-pay | Admitting: Internal Medicine

## 2020-11-12 ENCOUNTER — Other Ambulatory Visit: Payer: Self-pay

## 2020-11-12 ENCOUNTER — Ambulatory Visit (INDEPENDENT_AMBULATORY_CARE_PROVIDER_SITE_OTHER): Payer: Medicare Other | Admitting: Internal Medicine

## 2020-11-12 VITALS — BP 138/84 | HR 66 | Temp 98.6°F | Ht 64.0 in | Wt 207.0 lb

## 2020-11-12 DIAGNOSIS — K5900 Constipation, unspecified: Secondary | ICD-10-CM

## 2020-11-12 DIAGNOSIS — E538 Deficiency of other specified B group vitamins: Secondary | ICD-10-CM | POA: Diagnosis not present

## 2020-11-12 DIAGNOSIS — R7303 Prediabetes: Secondary | ICD-10-CM

## 2020-11-12 DIAGNOSIS — E039 Hypothyroidism, unspecified: Secondary | ICD-10-CM

## 2020-11-12 DIAGNOSIS — I10 Essential (primary) hypertension: Secondary | ICD-10-CM | POA: Diagnosis not present

## 2020-11-12 DIAGNOSIS — E559 Vitamin D deficiency, unspecified: Secondary | ICD-10-CM | POA: Diagnosis not present

## 2020-11-12 DIAGNOSIS — F3289 Other specified depressive episodes: Secondary | ICD-10-CM

## 2020-11-12 DIAGNOSIS — E7849 Other hyperlipidemia: Secondary | ICD-10-CM | POA: Diagnosis not present

## 2020-11-12 NOTE — Assessment & Plan Note (Signed)
Chronic Check lipid panel  Continue atorvastatin 10 mg qd Regular exercise and healthy diet encouraged  

## 2020-11-12 NOTE — Assessment & Plan Note (Signed)
Chronic  Clinically euthyroid Currently taking levothyroxine 50 mcg qd Check tsh  Titrate med dose if needed  

## 2020-11-12 NOTE — Assessment & Plan Note (Signed)
Chronic Check vitamin B12 level 

## 2020-11-12 NOTE — Assessment & Plan Note (Signed)
Chronic BP controlled Continue azor 5-40 mg qd cmp

## 2020-11-12 NOTE — Assessment & Plan Note (Signed)
Chronic Controlled, stable Continue lexapro 10 mg daily  

## 2020-11-12 NOTE — Assessment & Plan Note (Signed)
Chronic Check a1c Low sugar / carb diet Stressed regular exercise  

## 2020-11-12 NOTE — Assessment & Plan Note (Signed)
Chronic Not currently taking vitamin d Check vitamin d level

## 2020-11-12 NOTE — Assessment & Plan Note (Addendum)
Chronic Intermittent May be the cause of her intermittent left sided abdominal pain She will try fiber supplement daily

## 2020-11-13 LAB — COMPREHENSIVE METABOLIC PANEL
ALT: 25 U/L (ref 0–35)
AST: 25 U/L (ref 0–37)
Albumin: 4.1 g/dL (ref 3.5–5.2)
Alkaline Phosphatase: 95 U/L (ref 39–117)
BUN: 16 mg/dL (ref 6–23)
CO2: 29 mEq/L (ref 19–32)
Calcium: 9.2 mg/dL (ref 8.4–10.5)
Chloride: 104 mEq/L (ref 96–112)
Creatinine, Ser: 0.86 mg/dL (ref 0.40–1.20)
GFR: 70.09 mL/min (ref >60.00)
Glucose, Bld: 85 mg/dL (ref 70–99)
Potassium: 3.8 mEq/L (ref 3.5–5.1)
Sodium: 140 mEq/L (ref 135–145)
Total Bilirubin: 1.2 mg/dL (ref 0.2–1.2)
Total Protein: 7 g/dL (ref 6.0–8.3)

## 2020-11-13 LAB — VITAMIN D 25 HYDROXY (VIT D DEFICIENCY, FRACTURES): VITD: 25.07 ng/mL — ABNORMAL LOW (ref 30.00–100.00)

## 2020-11-13 LAB — LIPID PANEL
Cholesterol: 144 mg/dL (ref 0–200)
HDL: 40.4 mg/dL (ref >39.00)
NonHDL: 103.36
Total CHOL/HDL Ratio: 4
Triglycerides: 245 mg/dL — ABNORMAL HIGH (ref 0.0–149.0)
VLDL: 49 mg/dL — ABNORMAL HIGH (ref 0.0–40.0)

## 2020-11-13 LAB — VITAMIN B12: Vitamin B-12: 439 pg/mL (ref 211–911)

## 2020-11-13 LAB — TSH: TSH: 1.13 u[IU]/mL (ref 0.35–4.50)

## 2020-11-13 LAB — HEMOGLOBIN A1C: Hgb A1c MFr Bld: 6.2 % (ref 4.6–6.5)

## 2020-11-13 LAB — LDL CHOLESTEROL, DIRECT: Direct LDL: 79 mg/dL

## 2020-11-15 ENCOUNTER — Telehealth: Payer: Self-pay | Admitting: Internal Medicine

## 2020-11-15 NOTE — Chronic Care Management (AMB) (Signed)
  Chronic Care Management   Note  11/15/2020 Name: Alicia Hunt MRN: [id number] DOB: 1955/11/15  Alicia Hunt is a 65 y.o. year old female who is a primary care patient of Burns, Bobette Mo, MD. I reached out to Alicia Hunt by phone today in response to a referral sent by Ms. Doristine Counter Knudsen's PCP, Pincus Sanes, MD.   [name] was given information about Chronic Care Management services today including:  CCM service includes personalized support from designated clinical staff supervised by her physician, including individualized plan of care and coordination with other care providers 24/7 contact phone numbers for assistance for urgent and routine care needs. Service will only be billed when office clinical staff spend 20 minutes or more in a month to coordinate care. Only one practitioner may furnish and bill the service in a calendar month. The patient may stop CCM services at any time (effective at the end of the month) by phone call to the office staff.   Patient wishes to consider information provided and/or speak with a member of the care team before deciding about enrollment in care management services.   Follow up plan:   Carmell Austria [employer] Scheduler

## 2020-12-02 ENCOUNTER — Other Ambulatory Visit: Payer: Self-pay | Admitting: Internal Medicine

## 2020-12-04 ENCOUNTER — Telehealth: Payer: Self-pay | Admitting: Internal Medicine

## 2020-12-04 NOTE — Chronic Care Management (AMB) (Signed)
  Care Management   Follow Up Note   12/04/2020 Name: Daishia Fetterly MRN: [id number] DOB: 1955/08/16   Referred by: Pincus Sanes, MD Reason for referral : No chief complaint on file.   An unsuccessful telephone outreach was attempted today. The patient was referred to the case management team for assistance with care management and care coordination.   Follow Up Plan: The care management team will reach out to the patient again over the next 7 days.   Leonides Schanz [employer] Scheduler

## 2020-12-11 ENCOUNTER — Telehealth: Payer: Self-pay | Admitting: Internal Medicine

## 2020-12-11 NOTE — Chronic Care Management (AMB) (Signed)
  Chronic Care Management   Outreach Note  12/11/2020 Name: Alicia Hunt MRN: [id number] DOB: 09-Jun-1955  Referred by: Pincus Sanes, MD Reason for referral : No chief complaint on file.   A second unsuccessful telephone outreach was attempted today. The patient was referred to pharmacist for assistance with care management and care coordination.  Follow Up Plan:   Carmell Austria Upstream Scheduler

## 2021-01-09 ENCOUNTER — Telehealth: Payer: Self-pay | Admitting: Internal Medicine

## 2021-01-09 NOTE — Telephone Encounter (Signed)
Left message for patient to call me back at [phone] to schedule Medicare Annual Wellness Visit   No hx of AWV eligible as of 10/18/19  Please schedule at anytime with [facility] Advisor if patient calls the office back.    40 Minutes appointment   Any questions, please call me at [phone]

## 2021-02-07 ENCOUNTER — Telehealth: Payer: Self-pay | Admitting: Internal Medicine

## 2021-02-07 DIAGNOSIS — Z1211 Encounter for screening for malignant neoplasm of colon: Secondary | ICD-10-CM

## 2021-02-07 NOTE — Telephone Encounter (Signed)
Patient would like for provider to submit referral to [facility] for colonoscopy

## 2021-02-07 NOTE — Telephone Encounter (Signed)
ordered

## 2021-02-19 NOTE — Patient Instructions (Addendum)
Flu immunization administered today.    Blood work was ordered.     Medications changes include :   none.  Try fiber supplement daily.     An Ultrasound of your neck was ordered.     Please followup in 6 months   Health Maintenance, Female Adopting a healthy lifestyle and getting preventive care are important in promoting health and wellness. Ask your health care provider about: The right schedule for you to have regular tests and exams. Things you can do on your own to prevent diseases and keep yourself healthy. What should I know about diet, weight, and exercise? Eat a healthy diet  Eat a diet that includes plenty of vegetables, fruits, low-fat dairy products, and lean protein. Do not eat a lot of foods that are high in solid fats, added sugars, or sodium. Maintain a healthy weight Body mass index (BMI) is used to identify weight problems. It estimates body fat based on height and weight. Your health care provider can help determine your BMI and help you achieve or maintain a healthy weight. Get regular exercise Get regular exercise. This is one of the most important things you can do for your health. Most adults should: Exercise for at least 150 minutes each week. The exercise should increase your heart rate and make you sweat (moderate-intensity exercise). Do strengthening exercises at least twice a week. This is in addition to the moderate-intensity exercise. Spend less time sitting. Even light physical activity can be beneficial. Watch cholesterol and blood lipids Have your blood tested for lipids and cholesterol at 67 years of age, then have this test every 5 years. Have your cholesterol levels checked more often if: Your lipid or cholesterol levels are high. You are older than 67 years of age. You are at high risk for heart disease. What should I know about cancer screening? Depending on your health history and family history, you may need to have cancer screening  at various ages. This may include screening for: Breast cancer. Cervical cancer. Colorectal cancer. Skin cancer. Lung cancer. What should I know about heart disease, diabetes, and high blood pressure? Blood pressure and heart disease High blood pressure causes heart disease and increases the risk of stroke. This is more likely to develop in people who have high blood pressure readings, are of African descent, or are overweight. Have your blood pressure checked: Every 3-5 years if you are 54-76 years of age. Every year if you are 61 years old or older. Diabetes Have regular diabetes screenings. This checks your fasting blood sugar level. Have the screening done: Once every three years after age 34 if you are at a normal weight and have a low risk for diabetes. More often and at a younger age if you are overweight or have a high risk for diabetes. What should I know about preventing infection? Hepatitis B If you have a higher risk for hepatitis B, you should be screened for this virus. Talk with your health care provider to find out if you are at risk for hepatitis B infection. Hepatitis C Testing is recommended for: Everyone born from [birth year] through 1967. Anyone with known risk factors for hepatitis C. Sexually transmitted infections (STIs) Get screened for STIs, including gonorrhea and chlamydia, if: You are sexually active and are younger than 66 years of age. You are older than 67 years of age and your health care provider tells you that you are at risk for this type of infection. Your sexual activity  has changed since you were last screened, and you are at increased risk for chlamydia or gonorrhea. Ask your health care provider if you are at risk. Ask your health care provider about whether you are at high risk for HIV. Your health care provider may recommend a prescription medicine to help prevent HIV infection. If you choose to take medicine to prevent HIV, you should first get tested  for HIV. You should then be tested every 3 months for as long as you are taking the medicine. Pregnancy If you are about to stop having your period (premenopausal) and you may become pregnant, seek counseling before you get pregnant. Take 400 to 800 micrograms (mcg) of folic acid every day if you become pregnant. Ask for birth control (contraception) if you want to prevent pregnancy. Osteoporosis and menopause Osteoporosis is a disease in which the bones lose minerals and strength with aging. This can result in bone fractures. If you are 33 years old or older, or if you are at risk for osteoporosis and fractures, ask your health care provider if you should: Be screened for bone loss. Take a calcium or vitamin D supplement to lower your risk of fractures. Be given hormone replacement therapy (HRT) to treat symptoms of menopause. Follow these instructions at home: Lifestyle Do not use any products that contain nicotine or tobacco, such as cigarettes, e-cigarettes, and chewing tobacco. If you need help quitting, ask your health care provider. Do not use street drugs. Do not share needles. Ask your health care provider for help if you need support or information about quitting drugs. Alcohol use Do not drink alcohol if: Your health care provider tells you not to drink. You are pregnant, may be pregnant, or are planning to become pregnant. If you drink alcohol: Limit how much you use to 0-1 drink a day. Limit intake if you are breastfeeding. Be aware of how much alcohol is in your drink. In the [place] one drink equals one 12 oz bottle of beer (355 mL), one 5 oz glass of wine (148 mL), or one 1 oz glass of hard liquor (44 mL). General instructions Schedule regular health, dental, and eye exams. Stay current with your vaccines. Tell your health care provider if: You often feel depressed. You have ever been abused or do not feel safe at home. Summary Adopting a healthy lifestyle and getting  preventive care are important in promoting health and wellness. Follow your health care provider's instructions about healthy diet, exercising, and getting tested or screened for diseases. Follow your health care provider's instructions on monitoring your cholesterol and blood pressure. This information is not intended to replace advice given to you by your health care provider. Make sure you discuss any questions you have with your health care provider. Document Revised: 07/13/2020 Document Reviewed: 04/28/2018 Elsevier Patient Education  2022 [employer].

## 2021-02-19 NOTE — Progress Notes (Signed)
Subjective:    Patient ID: Alicia Hunt, female    DOB: [birth year], 65 y.o.   MRN: [id number]   This visit occurred during the SARS-CoV-2 public health emergency.  Safety protocols were in place, including screening questions prior to the visit, additional usage of staff PPE, and extensive cleaning of exam room while observing appropriate contact time as indicated for disinfecting solutions.    HPI She is here for a physical exam.   Just rejoined the gym again and has started going.  She has not been exercising regularly prior to that.  She has gained a few pounds and knows she needs to get that done.  She has had some increased heartburn and some left-sided abdominal pain.  She is constipated at times.  Medications and allergies reviewed with patient and updated if appropriate.  Patient Active Problem List   Diagnosis Date Noted   Mild concussion 01/05/2020   Closed fracture of distal clavicle 01/05/2020   Right shoulder pain 01/05/2020   Head injury 01/05/2020   Constipation 11/15/2019   Right lower quadrant abdominal pain 11/15/2019   Lumbar radiculopathy 11/01/2019   Chest pain 09/13/2019   Burning sensation of feet 09/13/2019   OSA on CPAP 09/13/2019   Hyperuricemia 11/22/2018   Prediabetes 01/07/2018   Hypertension 01/06/2018   Hypothyroidism 01/06/2018   Vitamin D deficiency 01/06/2018   Low vitamin B12 level 01/06/2018   Depression 01/06/2018   Other hyperlipidemia 01/06/2018   H/O Diverticulitis of colon with bleeding 01/06/2018    Current Outpatient Medications on File Prior to Visit  Medication Sig Dispense Refill   amLODipine-olmesartan (AZOR) 5-40 MG tablet TAKE 1 TABLET BY MOUTH  DAILY 90 tablet 3   atorvastatin (LIPITOR) 10 MG tablet TAKE 1 TABLET BY MOUTH  DAILY 90 tablet 3   escitalopram (LEXAPRO) 10 MG tablet TAKE 1 TABLET BY MOUTH  DAILY 90 tablet 3   levothyroxine (SYNTHROID) 50 MCG tablet TAKE 1 TABLET BY MOUTH  DAILY 90 tablet 3    Multiple Vitamin (MULTIVITAMIN) capsule Take 1 capsule by mouth daily.     traMADol-acetaminophen (ULTRACET) 37.5-325 MG tablet Take 1 tablet by mouth every 6 (six) hours as needed.     No current facility-administered medications on file prior to visit.    Past Medical History:  Diagnosis Date   Anxiety    CPAP (continuous positive airway pressure) dependence    Depression    Diverticulitis    Hyperlipidemia    Hypertension    Sleep apnea    Thyroid disease     Past Surgical History:  Procedure Laterality Date   ABDOMINAL HYSTERECTOMY     BREAST EXCISIONAL BIOPSY Left 20+ years   pt states dr didnt remove lump   CHOLECYSTECTOMY      Social History   Socioeconomic History   Marital status: Married    Spouse name: Not on file   Number of children: 2   Years of education: Not on file   Highest education level: Not on file  Occupational History   Not on file  Tobacco Use   Smoking status: Never   Smokeless tobacco: Never  Substance and Sexual Activity   Alcohol use: Yes   Drug use: Never   Sexual activity: Not on file  Other Topics Concern   Not on file  Social History Narrative   Not on file   Social Determinants of Health   Financial Resource Strain: Not on file  Food Insecurity: Not on file  Transportation Needs: Not on file  Physical Activity: Not on file  Stress: Not on file  Social Connections: Not on file    Family History  Problem Relation Age of Onset   Stroke Mother 63       cause of death    Review of Systems  Constitutional:  Negative for fever.  Eyes:  Negative for visual disturbance.  Respiratory:  Positive for shortness of breath (occ, up stairs). Negative for cough and wheezing.   Cardiovascular:  Negative for chest pain, palpitations and leg swelling.  Gastrointestinal:  Positive for abdominal distention (bloating), abdominal pain (occ on left side) and constipation (at times). Negative for blood in stool, diarrhea and nausea.        No gerd  Genitourinary:  Negative for dysuria.  Musculoskeletal:  Positive for back pain. Negative for arthralgias.  Skin:  Negative for rash.       Keloid on abdomen  Neurological:  Negative for dizziness, light-headedness and headaches.  Psychiatric/Behavioral:  Negative for dysphoric mood. The patient is not nervous/anxious.       Objective:   Vitals:   02/20/21 1005  BP: 120/78  Pulse: 63  Temp: 98 F (36.7 C)  SpO2: 98%   Filed Weights   02/20/21 1005  Weight: 210 lb (95.3 kg)   Body mass index is 36.05 kg/m.  BP Readings from Last 3 Encounters:  02/20/21 120/78  11/12/20 138/84  03/06/20 140/80    Wt Readings from Last 3 Encounters:  02/20/21 210 lb (95.3 kg)  11/12/20 207 lb (93.9 kg)  03/06/20 211 lb (95.7 kg)     Physical Exam Constitutional: She appears well-developed and well-nourished. No distress.  HENT:  Head: Normocephalic and atraumatic.  Right Ear: External ear normal. Normal ear canal and TM Left Ear: External ear normal.  Normal ear canal and TM Mouth/Throat: Oropharynx is clear and moist.  Eyes: Conjunctivae and EOM are normal.  Neck: Neck supple. No tracheal deviation present. No thyromegaly present.  Palpable skin lump right upper anterior neck-nontender-?  Lymph node versus cyst versus lipoma.  No other cervical lymphadenopathy no carotid bruit  Cardiovascular: Normal rate, regular rhythm and normal heart sounds.   No murmur heard.  No edema. Pulmonary/Chest: Effort normal and breath sounds normal. No respiratory distress. She has no wheezes. She has no rales.  Breast: deferred   Abdominal: Soft. She exhibits no distension. There is no tenderness.  Lymphadenopathy: She has no cervical adenopathy.  Skin: Skin is warm and dry. She is not diaphoretic.  Psychiatric: She has a normal mood and affect. Her behavior is normal.     Lab Results  Component Value Date   WBC 6.6 03/06/2020   HGB 14.0 03/06/2020   HCT 41.8 03/06/2020   PLT 220.0  03/06/2020   GLUCOSE 85 11/12/2020   CHOL 144 11/12/2020   TRIG 245.0 (H) 11/12/2020   HDL 40.40 11/12/2020   LDLDIRECT 79.0 11/12/2020   LDLCALC 56 03/06/2020   ALT 25 11/12/2020   AST 25 11/12/2020   NA 140 11/12/2020   K 3.8 11/12/2020   CL 104 11/12/2020   CREATININE 0.86 11/12/2020   BUN 16 11/12/2020   CO2 29 11/12/2020   TSH 1.13 11/12/2020   HGBA1C 6.2 11/12/2020         Assessment & Plan:   Physical exam: Screening blood work  ordered Exercise  regular-just restarted-stressed regular exercise Weight  encouraged weight loss Substance abuse  none   Reviewed recommended immunizations.  Flu vaccine today   Health Maintenance  Topic Date Due   COLONOSCOPY (Pts 45-15yrs Insurance coverage will need to be confirmed)  Never done   DEXA SCAN  Never done   MAMMOGRAM  05/01/2019   COVID-19 Vaccine (3 - Booster for Pfizer series) 11/17/2019   INFLUENZA VACCINE  12/17/2020   Zoster Vaccines- Shingrix (1 of 2) 05/23/2021 (Originally 11/01/2003)   TETANUS/TDAP  02/20/2022 (Originally 10/31/1972)   Hepatitis C Screening  09/13/2050 (Originally 11/01/1971)   HPV VACCINES  Aged Out          See Problem List for Assessment and Plan of chronic medical problems.

## 2021-02-20 ENCOUNTER — Other Ambulatory Visit: Payer: Self-pay

## 2021-02-20 ENCOUNTER — Ambulatory Visit (INDEPENDENT_AMBULATORY_CARE_PROVIDER_SITE_OTHER): Payer: Medicare Other | Admitting: Internal Medicine

## 2021-02-20 ENCOUNTER — Encounter: Payer: Self-pay | Admitting: Internal Medicine

## 2021-02-20 VITALS — BP 120/78 | HR 63 | Temp 98.0°F | Ht 64.0 in | Wt 210.0 lb

## 2021-02-20 DIAGNOSIS — Z13820 Encounter for screening for osteoporosis: Secondary | ICD-10-CM | POA: Diagnosis not present

## 2021-02-20 DIAGNOSIS — K5900 Constipation, unspecified: Secondary | ICD-10-CM

## 2021-02-20 DIAGNOSIS — R229 Localized swelling, mass and lump, unspecified: Secondary | ICD-10-CM | POA: Diagnosis not present

## 2021-02-20 DIAGNOSIS — I10 Essential (primary) hypertension: Secondary | ICD-10-CM

## 2021-02-20 DIAGNOSIS — Z0000 Encounter for general adult medical examination without abnormal findings: Secondary | ICD-10-CM

## 2021-02-20 DIAGNOSIS — E7849 Other hyperlipidemia: Secondary | ICD-10-CM | POA: Diagnosis not present

## 2021-02-20 DIAGNOSIS — E559 Vitamin D deficiency, unspecified: Secondary | ICD-10-CM

## 2021-02-20 DIAGNOSIS — Z0001 Encounter for general adult medical examination with abnormal findings: Secondary | ICD-10-CM

## 2021-02-20 DIAGNOSIS — K219 Gastro-esophageal reflux disease without esophagitis: Secondary | ICD-10-CM | POA: Diagnosis not present

## 2021-02-20 DIAGNOSIS — E538 Deficiency of other specified B group vitamins: Secondary | ICD-10-CM | POA: Diagnosis not present

## 2021-02-20 DIAGNOSIS — R7303 Prediabetes: Secondary | ICD-10-CM

## 2021-02-20 DIAGNOSIS — E039 Hypothyroidism, unspecified: Secondary | ICD-10-CM

## 2021-02-20 DIAGNOSIS — F3289 Other specified depressive episodes: Secondary | ICD-10-CM | POA: Diagnosis not present

## 2021-02-20 LAB — CBC WITH DIFFERENTIAL/PLATELET
Basophils Absolute: 0 10*3/uL (ref 0.0–0.1)
Basophils Relative: 0.4 % (ref 0.0–3.0)
Eosinophils Absolute: 0.2 10*3/uL (ref 0.0–0.7)
Eosinophils Relative: 3.4 % (ref 0.0–5.0)
HCT: 44.4 % (ref 36.0–46.0)
Hemoglobin: 14.5 g/dL (ref 12.0–15.0)
Lymphocytes Relative: 33 % (ref 12.0–46.0)
Lymphs Abs: 2.1 10*3/uL (ref 0.7–4.0)
MCHC: 32.7 g/dL (ref 30.0–36.0)
MCV: 84.8 fl (ref 78.0–100.0)
Monocytes Absolute: 0.7 10*3/uL (ref 0.1–1.0)
Monocytes Relative: 10.9 % (ref 3.0–12.0)
Neutro Abs: 3.4 10*3/uL (ref 1.4–7.7)
Neutrophils Relative %: 52.3 % (ref 43.0–77.0)
Platelets: 214 10*3/uL (ref 150.0–400.0)
RBC: 5.23 Mil/uL — ABNORMAL HIGH (ref 3.87–5.11)
RDW: 14 % (ref 11.5–15.5)
WBC: 6.4 10*3/uL (ref 4.0–10.5)

## 2021-02-20 LAB — COMPREHENSIVE METABOLIC PANEL
ALT: 30 U/L (ref 0–35)
AST: 27 U/L (ref 0–37)
Albumin: 4 g/dL (ref 3.5–5.2)
Alkaline Phosphatase: 89 U/L (ref 39–117)
BUN: 11 mg/dL (ref 6–23)
CO2: 30 mEq/L (ref 19–32)
Calcium: 9.4 mg/dL (ref 8.4–10.5)
Chloride: 102 mEq/L (ref 96–112)
Creatinine, Ser: 0.82 mg/dL (ref 0.40–1.20)
GFR: 74.08 mL/min (ref >60.00)
Glucose, Bld: 111 mg/dL — ABNORMAL HIGH (ref 70–99)
Potassium: 3.8 mEq/L (ref 3.5–5.1)
Sodium: 138 mEq/L (ref 135–145)
Total Bilirubin: 1.5 mg/dL — ABNORMAL HIGH (ref 0.2–1.2)
Total Protein: 7.1 g/dL (ref 6.0–8.3)

## 2021-02-20 LAB — LIPID PANEL
Cholesterol: 135 mg/dL (ref 0–200)
HDL: 44.7 mg/dL (ref >39.00)
LDL Cholesterol: 65 mg/dL (ref 0–99)
NonHDL: 90.55
Total CHOL/HDL Ratio: 3
Triglycerides: 129 mg/dL (ref 0.0–149.0)
VLDL: 25.8 mg/dL (ref 0.0–40.0)

## 2021-02-20 LAB — VITAMIN B12: Vitamin B-12: 358 pg/mL (ref 211–911)

## 2021-02-20 LAB — TSH: TSH: 2.27 u[IU]/mL (ref 0.35–5.50)

## 2021-02-20 LAB — VITAMIN D 25 HYDROXY (VIT D DEFICIENCY, FRACTURES): VITD: 29.53 ng/mL — ABNORMAL LOW (ref 30.00–100.00)

## 2021-02-20 LAB — HEMOGLOBIN A1C: Hgb A1c MFr Bld: 6.2 % (ref 4.6–6.5)

## 2021-02-20 NOTE — Assessment & Plan Note (Addendum)
Chronic Taking vitamin B12, but not regularly Check B12 level

## 2021-02-20 NOTE — Assessment & Plan Note (Signed)
Chronic He probably has some mild constipation Has had some intermittent left-sided abdominal pain in the past and this is the likely related to constipation She is due for colonoscopy and referral ordered so she can also discuss this with GI No blood in the stool, melena or weight loss Advised that she take fiber supplement on a daily basis to see if that helps

## 2021-02-20 NOTE — Assessment & Plan Note (Signed)
Chronic Controlled, Stable Continue Lexapro 10 mg daily 

## 2021-02-20 NOTE — Assessment & Plan Note (Addendum)
Chronic Taking vitamin D daily Check vitamin D level  

## 2021-02-20 NOTE — Assessment & Plan Note (Signed)
Chronic Blood pressure well controlled CMP Continue amlodipine-olmesartan 5-40 mg daily 

## 2021-02-20 NOTE — Assessment & Plan Note (Signed)
Chronic Check a1c Low sugar / carb diet Stressed regular exercise  

## 2021-02-20 NOTE — Assessment & Plan Note (Signed)
Chronic  Clinically euthyroid Currently taking levothyroxine 50 mcg daily Check tsh  Titrate med dose if needed  

## 2021-02-20 NOTE — Assessment & Plan Note (Signed)
Chronic °Regular exercise and healthy diet encouraged °Check lipid panel  °Continue Lipitor 10 mg daily °

## 2021-02-20 NOTE — Assessment & Plan Note (Signed)
Acute Has been experiencing intermittent GERD-mostly with acidic foods Decrease acidic foods, work on weight loss, eat small meals and do not lay down within 3 hours of eating She is taking Pepcid on occasion and advised that if it is flared up it is best to take it for a week or 2 and then stop it Call if no improvement

## 2021-02-20 NOTE — Assessment & Plan Note (Signed)
Acute Palpable skin lump anterior neck and right upper side Nontender She states it does increase in size at times ?  Lymph node versus cyst versus lipoma No other notable lump/lymphadenopathy Will order an ultrasound soft tissue of neck to evaluate further

## 2021-02-22 ENCOUNTER — Ambulatory Visit
Admission: RE | Admit: 2021-02-22 | Discharge: 2021-02-22 | Disposition: A | Discharge status: Home / Self Care | Payer: Medicare Other | Source: Ambulatory Visit | Attending: Internal Medicine | Admitting: Internal Medicine

## 2021-02-22 DIAGNOSIS — E041 Nontoxic single thyroid nodule: Secondary | ICD-10-CM | POA: Diagnosis not present

## 2021-02-22 DIAGNOSIS — R229 Localized swelling, mass and lump, unspecified: Secondary | ICD-10-CM

## 2021-02-27 ENCOUNTER — Other Ambulatory Visit: Payer: Self-pay | Admitting: Internal Medicine

## 2021-02-28 ENCOUNTER — Other Ambulatory Visit: Payer: Self-pay | Admitting: Internal Medicine

## 2021-03-12 ENCOUNTER — Other Ambulatory Visit: Payer: Self-pay | Admitting: Internal Medicine

## 2021-03-12 DIAGNOSIS — Z1231 Encounter for screening mammogram for malignant neoplasm of breast: Secondary | ICD-10-CM

## 2021-03-15 ENCOUNTER — Ambulatory Visit
Admission: RE | Admit: 2021-03-15 | Discharge: 2021-03-15 | Disposition: A | Discharge status: Home / Self Care | Payer: Medicare Other | Source: Ambulatory Visit | Attending: Internal Medicine | Admitting: Internal Medicine

## 2021-03-15 DIAGNOSIS — Z13820 Encounter for screening for osteoporosis: Secondary | ICD-10-CM

## 2021-03-15 DIAGNOSIS — M8588 Other specified disorders of bone density and structure, other site: Secondary | ICD-10-CM | POA: Diagnosis not present

## 2021-03-16 ENCOUNTER — Encounter: Payer: Self-pay | Admitting: Internal Medicine

## 2021-03-16 DIAGNOSIS — M8580 Other specified disorders of bone density and structure, unspecified site: Secondary | ICD-10-CM | POA: Insufficient documentation

## 2021-03-18 ENCOUNTER — Telehealth: Payer: Self-pay

## 2021-03-18 NOTE — Telephone Encounter (Signed)
Called the patient to schedule her appointment. She wants to be seen as soon as possible. Agreeable to seeing an APP. Scheduled to see Willette Cluster, NP on 03/21/21 at 1:30 pm. The patient complains of abdominal pain, stomach acid, reflux and constipation. She reports she has had issues with constipation for a while. She also tells me she has had an increase in her back pain chiefly on the right. The pain is into her right hip and she cannot lie on that side at all.  She is taking Pepcid Complete once daily, TUMS occasionally and daily Miralax, 1 dose.

## 2021-03-19 NOTE — Progress Notes (Signed)
Subjective:    Patient ID: Alicia Hunt, female    DOB: 01/12/1956, 65 y.o.   MRN: [id number]  This visit occurred during the SARS-CoV-2 public health emergency.  Safety protocols were in place, including screening questions prior to the visit, additional usage of staff PPE, and extensive cleaning of exam room while observing appropriate contact time as indicated for disinfecting solutions.    HPI The patient is here for an acute visit.   Severe abdominal/back pain -  started last Wednesday - it started with nausea, gas in upper abdomen, bloating, constant burping and gurgling.  The pain became diffuse - concentrated in the RLQ ( burning pain)and LLQ and epigastric area.  She would have intermittent back pain, which started the next day - it would be better when her abdominal pain was better.  Marland Kitchen        Has been taking miralax.  She has been having BM's 2-3 times  but she never feels she was emptying her bowels.  She started pepcid and tums and that helped.    50% better.   Burning into chest if she does not eat anything.  Dec appetite.  Eating soft, bland foods     S/p cholecystectomy years ago.  She does have history of diverticulitis.  Medications and allergies reviewed with patient and updated if appropriate.  Patient Active Problem List   Diagnosis Date Noted   Osteopenia 03/16/2021   GERD (gastroesophageal reflux disease) 02/20/2021   Lump of skin 02/20/2021   Mild concussion 01/05/2020   Closed fracture of distal clavicle 01/05/2020   Right shoulder pain 01/05/2020   Head injury 01/05/2020   Constipation 11/15/2019   Right lower quadrant abdominal pain 11/15/2019   Lumbar radiculopathy 11/01/2019   Chest pain 09/13/2019   Burning sensation of feet 09/13/2019   OSA on CPAP 09/13/2019   Hyperuricemia 11/22/2018   Prediabetes 01/07/2018   Hypertension 01/06/2018   Hypothyroidism 01/06/2018   Vitamin D deficiency 01/06/2018   Low vitamin B12 level  01/06/2018   Depression 01/06/2018   Other hyperlipidemia 01/06/2018   H/O Diverticulitis of colon with bleeding 01/06/2018    Current Outpatient Medications on File Prior to Visit  Medication Sig Dispense Refill   amLODipine-olmesartan (AZOR) 5-40 MG tablet TAKE 1 TABLET BY MOUTH  DAILY 90 tablet 3   atorvastatin (LIPITOR) 10 MG tablet TAKE 1 TABLET BY MOUTH  DAILY 90 tablet 3   escitalopram (LEXAPRO) 10 MG tablet TAKE 1 TABLET BY MOUTH  DAILY 90 tablet 3   levothyroxine (SYNTHROID) 50 MCG tablet TAKE 1 TABLET BY MOUTH  DAILY 90 tablet 3   Multiple Vitamin (MULTIVITAMIN) capsule Take 1 capsule by mouth daily.     traMADol-acetaminophen (ULTRACET) 37.5-325 MG tablet Take 1 tablet by mouth every 6 (six) hours as needed.     No current facility-administered medications on file prior to visit.    Past Medical History:  Diagnosis Date   Anxiety    CPAP (continuous positive airway pressure) dependence    Depression    Diverticulitis    Hyperlipidemia    Hypertension    Sleep apnea    Thyroid disease     Past Surgical History:  Procedure Laterality Date   ABDOMINAL HYSTERECTOMY     BREAST EXCISIONAL BIOPSY Left 20+ years   pt states dr didnt remove lump   CHOLECYSTECTOMY      Social History   Socioeconomic History   Marital status: Married    Spouse name:  Not on file   Number of children: 2   Years of education: Not on file   Highest education level: Not on file  Occupational History   Not on file  Tobacco Use   Smoking status: Never   Smokeless tobacco: Never  Substance and Sexual Activity   Alcohol use: Yes   Drug use: Never   Sexual activity: Not on file  Other Topics Concern   Not on file  Social History Narrative   Not on file   Social Determinants of Health   Financial Resource Strain: Not on file  Food Insecurity: Not on file  Transportation Needs: Not on file  Physical Activity: Not on file  Stress: Not on file  Social Connections: Not on file     Family History  Problem Relation Age of Onset   Stroke Mother 50       cause of death    Review of Systems  Constitutional:  Positive for appetite change and fatigue. Negative for fever.  Respiratory:  Negative for cough, shortness of breath and wheezing.   Cardiovascular:  Positive for chest pain (burning from stomach).  Gastrointestinal:  Positive for abdominal distention, constipation and nausea. Negative for abdominal pain, blood in stool (no melena) and diarrhea.  Genitourinary:  Positive for frequency. Negative for dysuria and hematuria.  Neurological:  Positive for headaches.      Objective:   Vitals:   03/20/21 0858  BP: 122/70  Pulse: (!) 58  Temp: 98 F (36.7 C)  SpO2: 97%   BP Readings from Last 3 Encounters:  03/20/21 122/70  02/20/21 120/78  11/12/20 138/84   Wt Readings from Last 3 Encounters:  03/20/21 210 lb (95.3 kg)  02/20/21 210 lb (95.3 kg)  11/12/20 207 lb (93.9 kg)   Body mass index is 36.05 kg/m.   Physical Exam Constitutional:      General: She is not in acute distress.    Appearance: Normal appearance. She is not ill-appearing.  HENT:     Head: Normocephalic and atraumatic.  Abdominal:     General: There is no distension.     Palpations: Abdomen is soft. There is no mass.     Tenderness: There is abdominal tenderness (RLQ and LLQ significnat tenderness, mild tenderness in epigastric region). There is guarding (mild in LLQ and RLQ). There is no rebound.     Comments: obese  Skin:    General: Skin is warm and dry.  Neurological:     Mental Status: She is alert.           Assessment & Plan:    RLQ, LLQ pain, GERD, urinary frequency: Acute Started one week ago Tender on exam in RLQ and LLQ with mild rebound, mild tenderness in epigastric region Not emptying with BM.s, flare of gerd  She has a flare of her GERD - start omeprazole 20 mg daily, take pepcid daily as well  Abdominal pain in RLQ and LLQ - appendicitis and  diverticulitis possible - stat CT ordered  Has mild urinary frequency  Cbc, cmp, UA, Ucx  Further treatment depending on above results

## 2021-03-19 NOTE — Telephone Encounter (Signed)
Thank you for following up and scheduling appointment.  I reviewed CT scan from 2021, no acute pathology.  Will evaluate during office visit to determine if she needs additional imaging or work-up for evaluation of severe abdominal pain.

## 2021-03-20 ENCOUNTER — Encounter: Payer: Self-pay | Admitting: Internal Medicine

## 2021-03-20 ENCOUNTER — Ambulatory Visit (INDEPENDENT_AMBULATORY_CARE_PROVIDER_SITE_OTHER)
Admission: RE | Admit: 2021-03-20 | Discharge: 2021-03-20 | Disposition: A | Discharge status: Home / Self Care | Payer: Medicare Other | Source: Ambulatory Visit | Attending: Internal Medicine | Admitting: Internal Medicine

## 2021-03-20 ENCOUNTER — Ambulatory Visit (INDEPENDENT_AMBULATORY_CARE_PROVIDER_SITE_OTHER): Payer: Medicare Other | Admitting: Internal Medicine

## 2021-03-20 ENCOUNTER — Other Ambulatory Visit: Payer: Self-pay

## 2021-03-20 VITALS — BP 122/70 | HR 58 | Temp 98.0°F | Ht 64.0 in | Wt 210.0 lb

## 2021-03-20 DIAGNOSIS — R350 Frequency of micturition: Secondary | ICD-10-CM | POA: Diagnosis not present

## 2021-03-20 DIAGNOSIS — R109 Unspecified abdominal pain: Secondary | ICD-10-CM | POA: Diagnosis not present

## 2021-03-20 DIAGNOSIS — R1031 Right lower quadrant pain: Secondary | ICD-10-CM | POA: Diagnosis not present

## 2021-03-20 DIAGNOSIS — R1010 Upper abdominal pain, unspecified: Secondary | ICD-10-CM

## 2021-03-20 LAB — CBC WITH DIFFERENTIAL/PLATELET
Basophils Absolute: 0 10*3/uL (ref 0.0–0.1)
Basophils Relative: 0.6 % (ref 0.0–3.0)
Eosinophils Absolute: 0.3 10*3/uL (ref 0.0–0.7)
Eosinophils Relative: 4.8 % (ref 0.0–5.0)
HCT: 42.5 % (ref 36.0–46.0)
Hemoglobin: 14.2 g/dL (ref 12.0–15.0)
Lymphocytes Relative: 29.7 % (ref 12.0–46.0)
Lymphs Abs: 1.9 10*3/uL (ref 0.7–4.0)
MCHC: 33.4 g/dL (ref 30.0–36.0)
MCV: 83.3 fl (ref 78.0–100.0)
Monocytes Absolute: 0.5 10*3/uL (ref 0.1–1.0)
Monocytes Relative: 8.1 % (ref 3.0–12.0)
Neutro Abs: 3.6 10*3/uL (ref 1.4–7.7)
Neutrophils Relative %: 56.8 % (ref 43.0–77.0)
Platelets: 197 10*3/uL (ref 150.0–400.0)
RBC: 5.1 Mil/uL (ref 3.87–5.11)
RDW: 13.8 % (ref 11.5–15.5)
WBC: 6.4 10*3/uL (ref 4.0–10.5)

## 2021-03-20 LAB — URINALYSIS, ROUTINE W REFLEX MICROSCOPIC
Bilirubin Urine: NEGATIVE
Hgb urine dipstick: NEGATIVE
Ketones, ur: NEGATIVE
Leukocytes,Ua: NEGATIVE
Nitrite: NEGATIVE
RBC / HPF: NONE SEEN (ref 0–?)
Specific Gravity, Urine: 1.015 (ref 1.000–1.030)
Total Protein, Urine: NEGATIVE
Urine Glucose: NEGATIVE
Urobilinogen, UA: 0.2 (ref 0.0–1.0)
pH: 6.5 (ref 5.0–8.0)

## 2021-03-20 LAB — COMPREHENSIVE METABOLIC PANEL
ALT: 22 U/L (ref 0–35)
AST: 18 U/L (ref 0–37)
Albumin: 3.9 g/dL (ref 3.5–5.2)
Alkaline Phosphatase: 95 U/L (ref 39–117)
BUN: 10 mg/dL (ref 6–23)
CO2: 30 mEq/L (ref 19–32)
Calcium: 9.1 mg/dL (ref 8.4–10.5)
Chloride: 104 mEq/L (ref 96–112)
Creatinine, Ser: 0.88 mg/dL (ref 0.40–1.20)
GFR: 68.02 mL/min (ref >60.00)
Glucose, Bld: 102 mg/dL — ABNORMAL HIGH (ref 70–99)
Potassium: 3.9 mEq/L (ref 3.5–5.1)
Sodium: 142 mEq/L (ref 135–145)
Total Bilirubin: 1.3 mg/dL — ABNORMAL HIGH (ref 0.2–1.2)
Total Protein: 7.1 g/dL (ref 6.0–8.3)

## 2021-03-20 MED ORDER — IOHEXOL 350 MG/ML SOLN
100.0000 mL | Freq: Once | INTRAVENOUS | Status: AC | PRN
  Administered 2021-03-20: 100 mL via INTRAVENOUS

## 2021-03-20 MED ORDER — OMEPRAZOLE 20 MG PO CPDR: 20.0000 mg | DELAYED_RELEASE_CAPSULE | Freq: Every day | ORAL | 3 refills | Status: DC

## 2021-03-20 NOTE — Telephone Encounter (Signed)
I have monitored the schedule for any cancellations. As of today there are no sooner openings than the scheduled appointment for 03/21/21.

## 2021-03-20 NOTE — Patient Instructions (Signed)
     Blood work was ordered.     Medications changes include :   omeprazole 20 mg daily.  Continue pepcid once daily.   Your prescription(s) have been submitted to your pharmacy. Please take as directed and contact our office if you believe you are having problem(s) with the medication(s).   A Ct scan of your abdomen was ordered.

## 2021-03-21 ENCOUNTER — Ambulatory Visit: Payer: Medicare Other | Admitting: Nurse Practitioner

## 2021-03-21 LAB — URINE CULTURE

## 2021-04-16 ENCOUNTER — Ambulatory Visit: Payer: Medicare Other

## 2021-04-19 ENCOUNTER — Ambulatory Visit: Payer: Medicare Other | Admitting: Gastroenterology

## 2021-04-19 ENCOUNTER — Emergency Department (HOSPITAL_COMMUNITY): Payer: Medicare Other

## 2021-04-19 ENCOUNTER — Emergency Department (HOSPITAL_COMMUNITY)
Admission: EM | Admit: 2021-04-19 | Discharge: 2021-04-19 | Disposition: A | Discharge status: Home / Self Care | Payer: Medicare Other | Attending: Emergency Medicine | Admitting: Emergency Medicine

## 2021-04-19 ENCOUNTER — Other Ambulatory Visit: Payer: Self-pay

## 2021-04-19 ENCOUNTER — Encounter: Payer: Self-pay | Admitting: Gastroenterology

## 2021-04-19 VITALS — BP 124/80 | HR 68 | Ht 63.0 in | Wt 211.1 lb

## 2021-04-19 DIAGNOSIS — J811 Chronic pulmonary edema: Secondary | ICD-10-CM | POA: Diagnosis not present

## 2021-04-19 DIAGNOSIS — E876 Hypokalemia: Secondary | ICD-10-CM | POA: Insufficient documentation

## 2021-04-19 DIAGNOSIS — R079 Chest pain, unspecified: Secondary | ICD-10-CM | POA: Diagnosis not present

## 2021-04-19 DIAGNOSIS — R42 Dizziness and giddiness: Secondary | ICD-10-CM | POA: Diagnosis not present

## 2021-04-19 DIAGNOSIS — R110 Nausea: Secondary | ICD-10-CM

## 2021-04-19 DIAGNOSIS — R1031 Right lower quadrant pain: Secondary | ICD-10-CM | POA: Diagnosis not present

## 2021-04-19 DIAGNOSIS — I499 Cardiac arrhythmia, unspecified: Secondary | ICD-10-CM | POA: Diagnosis not present

## 2021-04-19 DIAGNOSIS — R404 Transient alteration of awareness: Secondary | ICD-10-CM | POA: Diagnosis not present

## 2021-04-19 DIAGNOSIS — I10 Essential (primary) hypertension: Secondary | ICD-10-CM | POA: Diagnosis not present

## 2021-04-19 DIAGNOSIS — E039 Hypothyroidism, unspecified: Secondary | ICD-10-CM | POA: Diagnosis not present

## 2021-04-19 DIAGNOSIS — Z743 Need for continuous supervision: Secondary | ICD-10-CM | POA: Diagnosis not present

## 2021-04-19 DIAGNOSIS — R1012 Left upper quadrant pain: Secondary | ICD-10-CM | POA: Diagnosis not present

## 2021-04-19 DIAGNOSIS — Z20822 Contact with and (suspected) exposure to covid-19: Secondary | ICD-10-CM | POA: Diagnosis not present

## 2021-04-19 DIAGNOSIS — Z79899 Other long term (current) drug therapy: Secondary | ICD-10-CM | POA: Diagnosis not present

## 2021-04-19 DIAGNOSIS — R0789 Other chest pain: Secondary | ICD-10-CM | POA: Diagnosis not present

## 2021-04-19 DIAGNOSIS — R1013 Epigastric pain: Secondary | ICD-10-CM

## 2021-04-19 LAB — COMPREHENSIVE METABOLIC PANEL
ALT: 29 U/L (ref 0–44)
AST: 23 U/L (ref 15–41)
Albumin: 3.4 g/dL — ABNORMAL LOW (ref 3.5–5.0)
Alkaline Phosphatase: 78 U/L (ref 38–126)
Anion gap: 9 (ref 5–15)
BUN: 9 mg/dL (ref 8–23)
CO2: 25 mmol/L (ref 22–32)
Calcium: 8.7 mg/dL — ABNORMAL LOW (ref 8.9–10.3)
Chloride: 105 mmol/L (ref 98–111)
Creatinine, Ser: 0.77 mg/dL (ref 0.44–1.00)
GFR, Estimated: >60 mL/min (ref >60)
Glucose, Bld: 126 mg/dL — ABNORMAL HIGH (ref 70–99)
Potassium: 3.4 mmol/L — ABNORMAL LOW (ref 3.5–5.1)
Sodium: 139 mmol/L (ref 135–145)
Total Bilirubin: 1.4 mg/dL — ABNORMAL HIGH (ref 0.3–1.2)
Total Protein: 6.7 g/dL (ref 6.5–8.1)

## 2021-04-19 LAB — CBC WITH DIFFERENTIAL/PLATELET
Abs Immature Granulocytes: 0.02 10*3/uL (ref 0.00–0.07)
Basophils Absolute: 0 10*3/uL (ref 0.0–0.1)
Basophils Relative: 1 %
Eosinophils Absolute: 0.3 10*3/uL (ref 0.0–0.5)
Eosinophils Relative: 4 %
HCT: 41.5 % (ref 36.0–46.0)
Hemoglobin: 13.9 g/dL (ref 12.0–15.0)
Immature Granulocytes: 0 %
Lymphocytes Relative: 25 %
Lymphs Abs: 1.6 10*3/uL (ref 0.7–4.0)
MCH: 27.9 pg (ref 26.0–34.0)
MCHC: 33.5 g/dL (ref 30.0–36.0)
MCV: 83.3 fL (ref 80.0–100.0)
Monocytes Absolute: 0.6 10*3/uL (ref 0.1–1.0)
Monocytes Relative: 9 %
Neutro Abs: 4.1 10*3/uL (ref 1.7–7.7)
Neutrophils Relative %: 61 %
Platelets: 196 10*3/uL (ref 150–400)
RBC: 4.98 MIL/uL (ref 3.87–5.11)
RDW: 12.8 % (ref 11.5–15.5)
WBC: 6.6 10*3/uL (ref 4.0–10.5)
nRBC: 0 % (ref 0.0–0.2)

## 2021-04-19 LAB — URINALYSIS, ROUTINE W REFLEX MICROSCOPIC
Bilirubin Urine: NEGATIVE
Glucose, UA: NEGATIVE mg/dL
Hgb urine dipstick: NEGATIVE
Ketones, ur: NEGATIVE mg/dL
Leukocytes,Ua: NEGATIVE
Nitrite: NEGATIVE
Protein, ur: NEGATIVE mg/dL
Specific Gravity, Urine: 1.009 (ref 1.005–1.030)
pH: 7 (ref 5.0–8.0)

## 2021-04-19 LAB — RESP PANEL BY RT-PCR (FLU A&B, COVID) ARPGX2
Influenza A by PCR: NEGATIVE
Influenza B by PCR: NEGATIVE
SARS Coronavirus 2 by RT PCR: NEGATIVE

## 2021-04-19 LAB — TROPONIN I (HIGH SENSITIVITY)
Troponin I (High Sensitivity): 5 ng/L (ref <18)
Troponin I (High Sensitivity): 6 ng/L (ref <18)

## 2021-04-19 LAB — LIPASE, BLOOD: Lipase: 25 U/L (ref 11–51)

## 2021-04-19 LAB — BRAIN NATRIURETIC PEPTIDE: B Natriuretic Peptide: 46.7 pg/mL (ref 0.0–100.0)

## 2021-04-19 NOTE — ED Triage Notes (Signed)
Pt arrived by EMS from clinic. Pt started to complain of chest pain and feeling dizzy. Pt states that the room is spinning around her and she feels like she is going to pass out  EMS gave 4mg  Zofran and 324 of aspirin  Pt no longer has chest pain on arrival

## 2021-04-19 NOTE — ED Notes (Signed)
Pt states that she feels very lightheaded and dizzy initial when moving to sitting positions, feeling went away after sitting for and pt was able to stand for without any complaints.

## 2021-04-19 NOTE — ED Notes (Addendum)
Pt ambulatory to restroom denies feeling lightheaded or dizziness.

## 2021-04-19 NOTE — Progress Notes (Addendum)
Alicia Hunt    [id number]    06-19-1955  Primary Care Physician:Burns, Bobette Mo, MD  Referring Physician: Pincus Sanes, MD [address] [place],  [place] [address]   Chief complaint: Abdominal pain, GERD, constipation, heartburn  HPI: 65 year old very pleasant female here for new patient visit for evaluation of abdominal pain. She had an acute episode of epigastric, left upper quadrant and lower abdominal pain radiating to the back earlier this month.  She was seen by Dr. Lawerance Bach and was prescribed omeprazole 20 mg daily.  She was ordered for stat CT to exclude acute diverticulitis or acute pathology. CT abdomen pelvis with contrast March 20, 2021: 1. No acute CT findings of the abdomen or pelvis to explain lower abdominal pain. 2. Pancolonic diverticulosis without evidence of acute diverticulitis. 3. Status post cholecystectomy and hysterectomy. 4. Fat containing midline ventral hernia.  She has been feeling slightly better but continues to have left upper quadrant, epigastric and right lower quadrant abdominal pain intermittently.  It feels like throbbing pain.  She has been experiencing constipation with sensation of incomplete evacuation though she has bowel movement once every 1 to 2 days  She is s/p cholecystectomy 18 to 19 years ago.  She is s/p hysterectomy. She had diverticular hemorrhage 12 years ago, underwent colonoscopy at the time.  Other than diverticulosis exam was unremarkable per patient.  She recently came back from vacation in [place], [place].  She felt slightly dizzy on Sunday but has been feeling fine until now.  Denies any fever or sick contacts.  She took out Christmas decorations yesterday afternoon, felt cold symptoms thought maybe due to allergy and took NyQuil last night. No vomiting, melena or rectal bleeding.  She felt extremely dizzy and nauseous when she laid down on the exam table and has been unable to sit up.  She  has tingling in the left arm and chest heaviness.  Unclear if has any arm or leg weakness as unable to examine.  Repeat blood pressure 96/68, pulse 56 and O2 saturation 98%.  Called EMS to transport patient to [facility], ER   Outpatient Encounter Medications as of 04/19/2021  Medication Sig   amLODipine-olmesartan (AZOR) 5-40 MG tablet TAKE 1 TABLET BY MOUTH  DAILY   atorvastatin (LIPITOR) 10 MG tablet TAKE 1 TABLET BY MOUTH  DAILY   escitalopram (LEXAPRO) 10 MG tablet TAKE 1 TABLET BY MOUTH  DAILY   levothyroxine (SYNTHROID) 50 MCG tablet TAKE 1 TABLET BY MOUTH  DAILY   Multiple Vitamin (MULTIVITAMIN) capsule Take 1 capsule by mouth daily.   omeprazole (PRILOSEC) 20 MG capsule Take 1 capsule (20 mg total) by mouth daily.   traMADol-acetaminophen (ULTRACET) 37.5-325 MG tablet Take 1 tablet by mouth every 6 (six) hours as needed.   No facility-administered encounter medications on file as of 04/19/2021.    Allergies as of 04/19/2021   (No Known Allergies)    Past Medical History:  Diagnosis Date   Anxiety    CPAP (continuous positive airway pressure) dependence    Depression    Diverticulitis    Hyperlipidemia    Hypertension    Sleep apnea    Thyroid disease     Past Surgical History:  Procedure Laterality Date   ABDOMINAL HYSTERECTOMY     BREAST EXCISIONAL BIOPSY Left 20+ years   pt states dr didnt remove lump   CHOLECYSTECTOMY      Family History  Problem  Relation Age of Onset   Stroke Mother 73       cause of death    Social History   Socioeconomic History   Marital status: Married    Spouse name: Not on file   Number of children: 2   Years of education: Not on file   Highest education level: Not on file  Occupational History   Not on file  Tobacco Use   Smoking status: Never   Smokeless tobacco: Never  Substance and Sexual Activity   Alcohol use: Yes   Drug use: Never   Sexual activity: Not on file  Other Topics Concern   Not on file  Social  History Narrative   Not on file   Social Determinants of Health   Financial Resource Strain: Not on file  Food Insecurity: Not on file  Transportation Needs: Not on file  Physical Activity: Not on file  Stress: Not on file  Social Connections: Not on file  Intimate Partner Violence: Not on file      Review of systems: All other review of systems negative except as mentioned in the HPI.   Physical Exam: Vitals:   04/19/21 0926  BP: 124/80  Pulse: 68   There is no height or weight on file to calculate BMI. Gen:      No acute distress HEENT:  sclera anicteric CV: S1s2 regular Lungs: B/l clear, decreased breath sounds b/l lower Abd:      soft, right lower quadrant, epigastric and left upper quadrant abdominal tenderness, no palpable masses, no distension Ext:    No edema Neuro: alert and oriented x 3 Psych: normal mood and affect  Data Reviewed:  Reviewed labs, radiology imaging, old records and pertinent past GI work up   Assessment and Plan/Recommendations:  65 year old female with history of hypertension, hyperlipidemia, hypothyroidism here with complaints of epigastric, left upper quadrant and right lower quadrant abdominal pain, heartburn and constipation  Patient became extremely dizzy laying on the exam table, has been unable to sit up or move. Vitals are stable. Complains of chest discomfort associated with excessive sweating and tingling sensation.   Unclear if has any extremity weakness is unable to examine due to inability to sit or move due to significant dizziness  Will need to exclude ACS or CVA Called EMS to transfer patient to [facility] ER  Vitals remained stable , twelve-lead EKG was negative for STEMI  She was conversant and responding appropriately throughout visit  Further evaluation and management per ER.  She will benefit from EGD for further evaluation of epigastric and left upper quadrant abdominal pain, to exclude peptic ulcer disease  or erosive gastro esophagitis. Acute pancreatitis also in the differential She is due for colorectal cancer screening, plan for colonoscopy along with EGD once clinically stable to undergo procedures  Called her husband and informed him regarding her clinical status and EMS transfer to [facility], ER.  I also called her son Dr. Marjory Lies and her daughter-in-law to update, unable to reach either of them despite multiple attempts.   The patient was provided an opportunity to ask questions and all were answered. The patient agreed with the plan and demonstrated an understanding of the instructions.  Iona Beard , MD    CC: Pincus Sanes, MD

## 2021-04-19 NOTE — Discharge Instructions (Addendum)
You have been seen and discharged from the emergency department.  Your blood work was normal.  Your heart work-up was normal.  There were findings of mild dehydration and mild hypokalemia/hypocalcemia.  Your symptoms have resolved since being here.  Follow-up with your primary provider for reevaluation and further care.  They can do additional testing including hormone evaluation, ultrasound of the heart if necessary.  You may be suffering from a viral infection, fever feeling fatigued listen to your body and rest.  Stay well-hydrated.  Take home medications as prescribed. If you have any worsening symptoms, return of dizziness, headache, focal weakness, chest pain or difficulty breathing, passing out or further concerns for your health please return to an emergency department for further evaluation.

## 2021-04-19 NOTE — ED Notes (Signed)
Got patient undressed into a gown on the monitor did ekg shown to er provider patient is resting with family at bedside and call bell in reach

## 2021-04-19 NOTE — ED Provider Notes (Signed)
[facility] EMERGENCY DEPARTMENT Provider Note   CSN: [id number] Arrival date & time: 04/19/21  1050     History Chief Complaint  Patient presents with   Chest Pain   Dizziness    Alicia Hunt is a 65 y.o. female.  HPI  65 year old female with past medical history of HTN, HLD, sleep apnea with CPAP, diverticulitis presents emergency department with concern for lightheaded and dizziness with position change.  Patient went to the GI doctor today for follow-up of her ongoing abdominal pain.  She laid down for physical exam and then when she went to sit back up felt very dizzy like the room was spinning around her.  The office retook her blood pressure and noted that her systolic had dropped down to 90.  Patient felt like she was about to pass out.  She became anxious, developed chest tightness and this is when EMS was called.  On my evaluation patient is laying flat on her back, states that she feels improved especially if she is not moving.  She has no active chest pain or shortness of breath.  No headache or associated neurologic symptoms.  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  Over the past couple days has been dealing with generalized viral symptoms, taking over-the-counter medication for relief.  Past Medical History:  Diagnosis Date   Anxiety    CPAP (continuous positive airway pressure) dependence    Depression    Diverticulitis    Hyperlipidemia    Hypertension    Hypothyroidism    Sleep apnea    CPAP    Patient Active Problem List   Diagnosis Date Noted   Osteopenia 03/16/2021   GERD (gastroesophageal reflux disease) 02/20/2021   Lump of skin 02/20/2021   Mild concussion 01/05/2020   Closed fracture of distal clavicle 01/05/2020   Right shoulder pain 01/05/2020   Head injury 01/05/2020   Constipation 11/15/2019   Right lower quadrant abdominal pain 11/15/2019   Lumbar radiculopathy 11/01/2019   Chest pain 09/13/2019   Burning  sensation of feet 09/13/2019   OSA on CPAP 09/13/2019   Hyperuricemia 11/22/2018   Prediabetes 01/07/2018   Hypertension 01/06/2018   Hypothyroidism 01/06/2018   Vitamin D deficiency 01/06/2018   Low vitamin B12 level 01/06/2018   Depression 01/06/2018   Other hyperlipidemia 01/06/2018   H/O Diverticulitis of colon with bleeding 01/06/2018    Past Surgical History:  Procedure Laterality Date   ABDOMINAL HYSTERECTOMY     BREAST EXCISIONAL BIOPSY Left 20+ years   pt states dr Archie Balboa remove lump   CHOLECYSTECTOMY       OB History   No obstetric history on file.     Family History  Problem Relation Age of Onset   Stroke Mother 23       cause of death   Hypertension Mother    Hypertension Father     Social History   Tobacco Use   Smoking status: Never   Smokeless tobacco: Never  Vaping Use   Vaping Use: Never used  Substance Use Topics   Alcohol use: Yes    Comment: once a week-social   Drug use: Never    Home Medications Prior to Admission medications   Medication Sig Start Date End Date Taking? Authorizing Provider  amLODipine-olmesartan (AZOR) 5-40 MG tablet TAKE 1 TABLET BY MOUTH  DAILY 03/01/21   Pincus Sanes, MD  atorvastatin (LIPITOR) 10 MG tablet TAKE 1 TABLET BY MOUTH  DAILY 12/03/20   Burns,  Bobette Mo, MD  escitalopram (LEXAPRO) 10 MG tablet TAKE 1 TABLET BY MOUTH  DAILY 03/01/21   Pincus Sanes, MD  famotidine (PEPCID) 20 MG tablet Take 20 mg by mouth as needed for heartburn or indigestion.    [provider]  levothyroxine (SYNTHROID) 50 MCG tablet TAKE 1 TABLET BY MOUTH  DAILY 02/28/21   Pincus Sanes, MD  Multiple Vitamin (MULTIVITAMIN) capsule Take 1 capsule by mouth daily. 11/22/18   Pincus Sanes, MD  traMADol-acetaminophen (ULTRACET) 37.5-325 MG tablet Take 1 tablet by mouth every 6 (six) hours as needed.    [provider]    Allergies    Patient has no known allergies.  Review of Systems   Review of Systems   Constitutional:  Positive for fatigue. Negative for chills and fever.  HENT:  Negative for congestion.   Eyes:  Negative for visual disturbance.  Respiratory:  Positive for chest tightness. Negative for cough and shortness of breath.   Cardiovascular:  Negative for chest pain, palpitations and leg swelling.  Gastrointestinal:  Negative for abdominal pain, diarrhea and vomiting.  Genitourinary:  Negative for dysuria and flank pain.  Skin:  Negative for rash.  Neurological:  Positive for dizziness. Negative for headaches.  Psychiatric/Behavioral:  The patient is nervous/anxious.    Physical Exam Updated Vital Signs BP 132/72   Pulse 63   Temp 97.6 F (36.4 C) (Oral)   Resp (!) 9   Ht 5\' 3"  (1.6 m)   Wt 95 kg   SpO2 94%   BMI 37.10 kg/m   Physical Exam Vitals and nursing note reviewed.  Constitutional:      Appearance: Normal appearance.  HENT:     Head: Normocephalic.     Mouth/Throat:     Mouth: Mucous membranes are moist.  Eyes:     Pupils: Pupils are equal, round, and reactive to light.  Cardiovascular:     Rate and Rhythm: Normal rate.  Pulmonary:     Effort: Pulmonary effort is normal. No tachypnea or respiratory distress.     Breath sounds: Examination of the right-lower field reveals rales. Examination of the left-lower field reveals rales. Rales present.  Abdominal:     Palpations: Abdomen is soft.     Tenderness: There is no abdominal tenderness.  Musculoskeletal:     Right lower leg: No edema.     Left lower leg: No edema.  Skin:    General: Skin is warm.  Neurological:     Mental Status: She is alert and oriented to person, place, and time. Mental status is at baseline.  Psychiatric:        Mood and Affect: Mood normal.    ED Results / Procedures / Treatments   Labs (all labs ordered are listed, but only abnormal results are displayed) Labs Reviewed  COMPREHENSIVE METABOLIC PANEL - Abnormal; Notable for the following components:      Result Value    Potassium 3.4 (*)    Glucose, Bld 126 (*)    Calcium 8.7 (*)    Albumin 3.4 (*)    Total Bilirubin 1.4 (*)    All other components within normal limits  RESP PANEL BY RT-PCR (FLU A&B, COVID) ARPGX2  CBC WITH DIFFERENTIAL/PLATELET  LIPASE, BLOOD  URINALYSIS, ROUTINE W REFLEX MICROSCOPIC  BRAIN NATRIURETIC PEPTIDE  TROPONIN I (HIGH SENSITIVITY)  TROPONIN I (HIGH SENSITIVITY)    EKG EKG Interpretation  Date/Time:  Friday April 19 2021 11:02:38 EST Ventricular Rate:  62 PR  Interval:  193 QRS Duration: 93 QT Interval:  455 QTC Calculation: 463 R Axis:   -3 Text Interpretation: Sinus rhythm Borderline low voltage, extremity leads NSR Confirmed by Coralee Pesa [phone]) on 04/19/2021 12:04:51 PM  Radiology DG Chest Port 1 View  Result Date: 04/19/2021 CLINICAL DATA:  Chest pain and dizziness. EXAM: PORTABLE CHEST 1 VIEW COMPARISON:  None. FINDINGS: Low lung volumes with diffuse hazy interstitial thickening. No focal consolidation or pleural effusion. Enlarged cardiomediastinal silhouette which may be accentuated by portable technique and low lung volumes. No acute osseous abnormality. IMPRESSION: Enlarged cardiac silhouette with mild pulmonary edema. Electronically Signed   By: Sherron Ales M.D.   On: 04/19/2021 11:42    Procedures Procedures   Medications Ordered in ED Medications - No data to display  ED Course  I have reviewed the triage vital signs and the nursing notes.  Pertinent labs & imaging results that were available during my care of the patient were reviewed by me and considered in my medical decision making (see chart for details).    MDM Rules/Calculators/A&P                           65 year old female presents to the emergency department after an episode of positional dizziness.  Patient was at the GI doctor's office, laying flat for an abdominal exam.  Upon sitting up became dizzy, blood pressure dropped down to 90 systolic.  She was laid flat and felt  improved.  Denied any headache or acute neuro symptoms.  She did develop transient chest heaviness but no other shortness of breath.  All of her symptoms have resolved on arrival except for fatigue.  She feels baseline laying flat.  Orthostatic vitals here show a drop from sitting to standing and she becomes slightly dizzy but it is not positive orthostatics.  EKG is reassuring, similar to previous with no arrhythmia or abnormal intervals.  Blood work is reassuring.  Very mild hypokalemia and hypocalcemia.  Troponin is negative x2, BNP is negative.  Chest x-ray was initially taken in the full laying position.  They mention cardiomegaly but this is most likely due to the technique and the patient laying completely flat.  No findings of heart failure.  She is had no tachycardia or hypoxia or shortness of breath, no signs of PE.  Patient was allowed to eat and drink.  Since she has been up and ambulating without any symptoms.  The dizziness has resolved.  With the resolution low suspicion for CVA.  I believe that this was positional especially with a drop in blood pressure.  I encouraged hydration and resting.  She is still fatigued in regards to viral syndrome.  Plan for outpatient follow-up with strict return to ED precautions.  Final Clinical Impression(s) / ED Diagnoses Final diagnoses:  None    Rx / DC Orders ED Discharge Orders     None        Rozelle Logan, DO 04/19/21 1616

## 2021-04-24 ENCOUNTER — Telehealth: Payer: Self-pay

## 2021-04-24 NOTE — Telephone Encounter (Signed)
Patient is wondering if she can be seen earlier than Tuesday. She still feels pretty bad.

## 2021-04-25 DIAGNOSIS — R42 Dizziness and giddiness: Secondary | ICD-10-CM | POA: Insufficient documentation

## 2021-04-25 NOTE — Progress Notes (Signed)
Subjective:    Patient ID: Alicia Hunt, female    DOB: 1956-01-05, 65 y.o.   MRN: [id number]  This visit occurred during the SARS-CoV-2 public health emergency.  Safety protocols were in place, including screening questions prior to the visit, additional usage of staff PPE, and extensive cleaning of exam room while observing appropriate contact time as indicated for disinfecting solutions.     HPI The patient is here for follow up from the ED.  She went to the ED from her GI appt due to lightheaded/dizziness with position changes.  This occurred when she sat up from laying down.  Her BP was noted to be 90/?Marland Kitchen  She became anxious and felt like she was going to pass out.  She developed chest tightness.  EMS was called.  In the ED she felt better especially when not moving.  She did not have chest pain in the ED or SOB, headache or neuro symptoms.  She did have some viral symptoms and was taking otc cold meds.  Blood work including troponin x 2, BNP neg, EKG, CXR, urine and covid/flu tests unremarkable.  VSS in ED.    Her BP decreased with position changes but was not orthostatic.  Dizziness resolved. She still had some fatigue from her viral illness.    The night that she left the hospital she felt like the cold came on full force.  She has been sick since then and has not felt better.  She continues to feel fatigued, chilled, has headaches, shortness of breath, tightness in her chest.  She denies much of a cough at this time, but did have 1 earlier.  She does have congestion and postnasal drainage.  She is using her CPAP.  She is taking over-the-counter cold medications.  Her husband had the same illness that she had initially and has recovered and she is not sure why she has them.  Typically she recovers quickly.  In the ED chest x-ray was negative.  Blood work was unremarkable.  Test for flu and COVID were negative.  Medications and allergies reviewed with patient and  updated if appropriate.  Patient Active Problem List   Diagnosis Date Noted   Dizziness 04/25/2021   Osteopenia 03/16/2021   GERD (gastroesophageal reflux disease) 02/20/2021   Lump of skin 02/20/2021   Mild concussion 01/05/2020   Closed fracture of distal clavicle 01/05/2020   Right shoulder pain 01/05/2020   Head injury 01/05/2020   Constipation 11/15/2019   Right lower quadrant abdominal pain 11/15/2019   Lumbar radiculopathy 11/01/2019   Chest pain 09/13/2019   Burning sensation of feet 09/13/2019   OSA on CPAP 09/13/2019   Hyperuricemia 11/22/2018   Prediabetes 01/07/2018   Hypertension 01/06/2018   Hypothyroidism 01/06/2018   Vitamin D deficiency 01/06/2018   Low vitamin B12 level 01/06/2018   Depression 01/06/2018   Other hyperlipidemia 01/06/2018   H/O Diverticulitis of colon with bleeding 01/06/2018    Current Outpatient Medications on File Prior to Visit  Medication Sig Dispense Refill   amLODipine-olmesartan (AZOR) 5-40 MG tablet TAKE 1 TABLET BY MOUTH  DAILY 90 tablet 3   atorvastatin (LIPITOR) 10 MG tablet TAKE 1 TABLET BY MOUTH  DAILY 90 tablet 3   escitalopram (LEXAPRO) 10 MG tablet TAKE 1 TABLET BY MOUTH  DAILY 90 tablet 3   famotidine (PEPCID) 20 MG tablet Take 20 mg by mouth as needed for heartburn or indigestion.     levothyroxine (SYNTHROID) 50 MCG tablet  TAKE 1 TABLET BY MOUTH  DAILY 90 tablet 3   Multiple Vitamin (MULTIVITAMIN) capsule Take 1 capsule by mouth daily.     traMADol-acetaminophen (ULTRACET) 37.5-325 MG tablet Take 1 tablet by mouth every 6 (six) hours as needed.     No current facility-administered medications on file prior to visit.    Past Medical History:  Diagnosis Date   Anxiety    CPAP (continuous positive airway pressure) dependence    Depression    Diverticulitis    Hyperlipidemia    Hypertension    Hypothyroidism    Sleep apnea    CPAP    Past Surgical History:  Procedure Laterality Date   ABDOMINAL HYSTERECTOMY      BREAST EXCISIONAL BIOPSY Left 20+ years   pt states dr didnt remove lump   CHOLECYSTECTOMY      Social History   Socioeconomic History   Marital status: Married    Spouse name: Not on file   Number of children: 2   Years of education: Not on file   Highest education level: Not on file  Occupational History   Occupation: housewife  Tobacco Use   Smoking status: Never   Smokeless tobacco: Never  Vaping Use   Vaping Use: Never used  Substance and Sexual Activity   Alcohol use: Yes    Comment: once a week-social   Drug use: Never   Sexual activity: Not on file  Other Topics Concern   Not on file  Social History Narrative   Not on file   Social Determinants of Health   Financial Resource Strain: Not on file  Food Insecurity: Not on file  Transportation Needs: Not on file  Physical Activity: Not on file  Stress: Not on file  Social Connections: Not on file    Family History  Problem Relation Age of Onset   Stroke Mother 24       cause of death   Hypertension Mother    Hypertension Father     Review of Systems  Constitutional:  Positive for chills and fatigue. Negative for fever.  HENT:  Positive for congestion and postnasal drip. Negative for sinus pressure, sinus pain and sore throat.   Respiratory:  Positive for chest tightness and shortness of breath. Negative for cough (had it the first couple of days) and wheezing.   Musculoskeletal:  Negative for myalgias.  Neurological:  Positive for headaches.      Objective:   Vitals:   04/26/21 1545  BP: 120/82  Pulse: 64  Temp: 98.7 F (37.1 C)  SpO2: 96%   BP Readings from Last 3 Encounters:  04/26/21 120/82  04/19/21 114/68  04/19/21 124/80   Wt Readings from Last 3 Encounters:  04/26/21 212 lb (96.2 kg)  04/19/21 209 lb 7 oz (95 kg)  04/19/21 211 lb 2 oz (95.8 kg)   Body mass index is 37.55 kg/m.   Physical Exam    GENERAL APPEARANCE: Appears stated age, mildly ill-fatigued appearing,  NAD EYES: conjunctiva clear, no icterus HENT: bilateral tympanic membranes and ear canals normal, oropharynx with no erythema or exudates, trachea midline, no cervical or supraclavicular lymphadenopathy LUNGS: Unlabored breathing, good air entry bilaterally, clear to auscultation without wheeze or crackles CARDIOVASCULAR: Normal S1,S2 , no edema SKIN: Warm, dry   Lab Results  Component Value Date   WBC 6.6 04/19/2021   HGB 13.9 04/19/2021   HCT 41.5 04/19/2021   PLT 196 04/19/2021   GLUCOSE 126 (H) 04/19/2021   CHOL 135  02/20/2021   TRIG 129.0 02/20/2021   HDL 44.70 02/20/2021   LDLDIRECT 79.0 11/12/2020   LDLCALC 65 02/20/2021   ALT 29 04/19/2021   AST 23 04/19/2021   NA 139 04/19/2021   K 3.4 (L) 04/19/2021   CL 105 04/19/2021   CREATININE 0.77 04/19/2021   BUN 9 04/19/2021   CO2 25 04/19/2021   TSH 2.27 02/20/2021   HGBA1C 6.2 02/20/2021    DG Chest Port 1 View CLINICAL DATA:  Chest pain and dizziness.  EXAM: PORTABLE CHEST 1 VIEW  COMPARISON:  None.  FINDINGS: Low lung volumes with diffuse hazy interstitial thickening. No focal consolidation or pleural effusion. Enlarged cardiomediastinal silhouette which may be accentuated by portable technique and low lung volumes. No acute osseous abnormality.  IMPRESSION: Enlarged cardiac silhouette with mild pulmonary edema.  Electronically Signed   By: Sherron Ales M.D.   On: 04/19/2021 11:42  On the above chest x-ray she was laying down which will likely exaggerated the enlarged cardiac silhouette.   Assessment & Plan:    See Problem List for Assessment and Plan of chronic medical problems.

## 2021-04-25 NOTE — Telephone Encounter (Signed)
Unable to leave VM as she does not have this set up on her phone yet.  Sent my-chart that we can see her tomorrow.  She needs to just confirm if she can come or not.

## 2021-04-26 ENCOUNTER — Ambulatory Visit (INDEPENDENT_AMBULATORY_CARE_PROVIDER_SITE_OTHER): Payer: Medicare Other | Admitting: Internal Medicine

## 2021-04-26 ENCOUNTER — Other Ambulatory Visit: Payer: Self-pay

## 2021-04-26 ENCOUNTER — Encounter: Payer: Self-pay | Admitting: Internal Medicine

## 2021-04-26 DIAGNOSIS — R42 Dizziness and giddiness: Secondary | ICD-10-CM

## 2021-04-26 DIAGNOSIS — J069 Acute upper respiratory infection, unspecified: Secondary | ICD-10-CM | POA: Insufficient documentation

## 2021-04-26 MED ORDER — CEFDINIR 300 MG PO CAPS: 300.0000 mg | ORAL_CAPSULE | Freq: Two times a day (BID) | ORAL | 0 refills | Status: DC

## 2021-04-26 NOTE — Assessment & Plan Note (Signed)
Acute She has had URI symptoms for over a week and a half and is still not feeling better Continues to feel extremely tired, short of breath, tightness in her chest, congestion Flu and COVID test have been negative, chest x-ray was negative and blood work was unremarkable If this was viral and would have expected her to start to feel better at this point and I am concerned she may have a secondary bacterial infection Will cover with Omnicef 300 mg twice daily Continue increased rest and fluids and over-the-counter cold medications for symptom relief She will update me in a couple of days and how she is doing-if not better will need full evaluation again

## 2021-04-26 NOTE — Patient Instructions (Addendum)
     Medications changes include :   omnicef twice daily for 10 days  Your prescription(s) have been submitted to your pharmacy. Please take as directed and contact our office if you believe you are having problem(s) with the medication(s).

## 2021-04-30 ENCOUNTER — Ambulatory Visit: Payer: Medicare Other | Admitting: Gastroenterology

## 2021-04-30 ENCOUNTER — Ambulatory Visit: Payer: Medicare Other | Admitting: Internal Medicine

## 2021-05-05 ENCOUNTER — Encounter: Payer: Self-pay | Admitting: Internal Medicine

## 2021-05-05 NOTE — Progress Notes (Signed)
Subjective:    Patient ID: Alicia Hunt, female    DOB: [birth year], 65 y.o.   MRN: [id number]  This visit occurred during the SARS-CoV-2 public health emergency.  Safety protocols were in place, including screening questions prior to the visit, additional usage of staff PPE, and extensive cleaning of exam room while observing appropriate contact time as indicated for disinfecting solutions.    HPI The patient is here for an acute visit.   Rash - red patchy rash on lower legs and cold feet.  Friday night, 3 nights ago she noticed it - redness on her right two lateral toes and a patch of redness on her left anterior lower leg.  She has several small red  dots up her leg. The rash is fading .  Stopped abx per her son's advice ( had taken it for 8 days).  It was very red -- now slightly more purplish  - no itchiness or pain.  Had cold feet - the day the rash started- it is better   She is still SOB and has chest tightness, along with a lot of drainage  Has had some increased joint pain - knees recently.  No joint swelling.    Her sister has an autoimmune dz.   Medications and allergies reviewed with patient and updated if appropriate.  Patient Active Problem List   Diagnosis Date Noted   URI (upper respiratory infection) 04/26/2021   Dizziness 04/25/2021   Osteopenia 03/16/2021   GERD (gastroesophageal reflux disease) 02/20/2021   Lump of skin 02/20/2021   Mild concussion 01/05/2020   Closed fracture of distal clavicle 01/05/2020   Right shoulder pain 01/05/2020   Head injury 01/05/2020   Constipation 11/15/2019   Right lower quadrant abdominal pain 11/15/2019   Lumbar radiculopathy 11/01/2019   Chest pain 09/13/2019   Burning sensation of feet 09/13/2019   OSA on CPAP 09/13/2019   Hyperuricemia 11/22/2018   Prediabetes 01/07/2018   Hypertension 01/06/2018   Hypothyroidism 01/06/2018   Vitamin D deficiency 01/06/2018   Low vitamin B12 level 01/06/2018    Depression 01/06/2018   Other hyperlipidemia 01/06/2018   H/O Diverticulitis of colon with bleeding 01/06/2018    Current Outpatient Medications on File Prior to Visit  Medication Sig Dispense Refill   amLODipine-olmesartan (AZOR) 5-40 MG tablet TAKE 1 TABLET BY MOUTH  DAILY 90 tablet 3   atorvastatin (LIPITOR) 10 MG tablet TAKE 1 TABLET BY MOUTH  DAILY 90 tablet 3   escitalopram (LEXAPRO) 10 MG tablet TAKE 1 TABLET BY MOUTH  DAILY 90 tablet 3   famotidine (PEPCID) 20 MG tablet Take 20 mg by mouth as needed for heartburn or indigestion.     levothyroxine (SYNTHROID) 50 MCG tablet TAKE 1 TABLET BY MOUTH  DAILY 90 tablet 3   Multiple Vitamin (MULTIVITAMIN) capsule Take 1 capsule by mouth daily.     traMADol-acetaminophen (ULTRACET) 37.5-325 MG tablet Take 1 tablet by mouth every 6 (six) hours as needed.     cefdinir (OMNICEF) 300 MG capsule Take 1 capsule (300 mg total) by mouth 2 (two) times daily. (Patient not taking: Reported on 05/06/2021) 20 capsule 0   No current facility-administered medications on file prior to visit.    Past Medical History:  Diagnosis Date   Anxiety    CPAP (continuous positive airway pressure) dependence    Depression    Diverticulitis    Hyperlipidemia    Hypertension    Hypothyroidism    Sleep apnea  CPAP    Past Surgical History:  Procedure Laterality Date   ABDOMINAL HYSTERECTOMY     BREAST EXCISIONAL BIOPSY Left 20+ years   pt states dr didnt remove lump   CHOLECYSTECTOMY      Social History   Socioeconomic History   Marital status: Married    Spouse name: Not on file   Number of children: 2   Years of education: Not on file   Highest education level: Not on file  Occupational History   Occupation: housewife  Tobacco Use   Smoking status: Never   Smokeless tobacco: Never  Vaping Use   Vaping Use: Never used  Substance and Sexual Activity   Alcohol use: Yes    Comment: once a week-social   Drug use: Never   Sexual activity:  Not on file  Other Topics Concern   Not on file  Social History Narrative   Not on file   Social Determinants of Health   Financial Resource Strain: Not on file  Food Insecurity: Not on file  Transportation Needs: Not on file  Physical Activity: Not on file  Stress: Not on file  Social Connections: Not on file    Family History  Problem Relation Age of Onset   Stroke Mother 89       cause of death   Hypertension Mother    Hypertension Father     Review of Systems  Constitutional:  Positive for fatigue. Negative for appetite change and fever.  HENT:  Negative for congestion, ear pain and sore throat.   Respiratory:  Positive for chest tightness and shortness of breath. Negative for cough and wheezing.   Cardiovascular:  Positive for chest pain (hcest tightness). Negative for palpitations.  Gastrointestinal:  Positive for nausea.  Musculoskeletal:  Positive for arthralgias.  Skin:  Positive for rash.  Neurological:  Positive for dizziness, light-headedness and headaches.      Objective:   Vitals:   05/06/21 1021  BP: 128/74  Pulse: 80  Temp: 98.3 F (36.8 C)  SpO2: 98%   BP Readings from Last 3 Encounters:  05/06/21 128/74  04/26/21 120/82  04/19/21 114/68   Wt Readings from Last 3 Encounters:  05/06/21 211 lb 3.2 oz (95.8 kg)  04/26/21 212 lb (96.2 kg)  04/19/21 209 lb 7 oz (95 kg)   Body mass index is 37.41 kg/m.   Physical Exam    Constitutional: Appears well-developed and well-nourished. No distress.  Head: Normocephalic and atraumatic.  Neck: Neck supple. No tracheal deviation present. No thyromegaly present.  No cervical lymphadenopathy Cardiovascular: Normal rate, regular rhythm and normal heart sounds.  No murmur heard. No carotid bruit .  No edema Pulmonary/Chest: Effort normal and breath sounds normal. No respiratory distress. No has no wheezes. No rales.  Skin: Skin is warm and dry. Not diaphoretic. Macular rash b/l LE - right lateral dorsal  surface, large patch on left anterior lower leg and several small dots going up leg Psychiatric: Normal mood and affect. Behavior is normal.       Assessment & Plan:    See Problem List for Assessment and Plan of chronic medical problems.

## 2021-05-06 ENCOUNTER — Ambulatory Visit (INDEPENDENT_AMBULATORY_CARE_PROVIDER_SITE_OTHER): Payer: Medicare Other | Admitting: Internal Medicine

## 2021-05-06 ENCOUNTER — Other Ambulatory Visit: Payer: Self-pay

## 2021-05-06 VITALS — BP 128/74 | HR 80 | Temp 98.3°F | Ht 63.0 in | Wt 211.2 lb

## 2021-05-06 DIAGNOSIS — R0789 Other chest pain: Secondary | ICD-10-CM | POA: Diagnosis not present

## 2021-05-06 DIAGNOSIS — R0982 Postnasal drip: Secondary | ICD-10-CM

## 2021-05-06 DIAGNOSIS — I10 Essential (primary) hypertension: Secondary | ICD-10-CM

## 2021-05-06 DIAGNOSIS — M2550 Pain in unspecified joint: Secondary | ICD-10-CM

## 2021-05-06 DIAGNOSIS — R21 Rash and other nonspecific skin eruption: Secondary | ICD-10-CM | POA: Diagnosis not present

## 2021-05-06 LAB — COMPREHENSIVE METABOLIC PANEL
ALT: 29 U/L (ref 0–35)
AST: 23 U/L (ref 0–37)
Albumin: 3.9 g/dL (ref 3.5–5.2)
Alkaline Phosphatase: 84 U/L (ref 39–117)
BUN: 11 mg/dL (ref 6–23)
CO2: 28 mEq/L (ref 19–32)
Calcium: 9.4 mg/dL (ref 8.4–10.5)
Chloride: 103 mEq/L (ref 96–112)
Creatinine, Ser: 0.76 mg/dL (ref 0.40–1.20)
GFR: 81.03 mL/min (ref >60.00)
Glucose, Bld: 100 mg/dL — ABNORMAL HIGH (ref 70–99)
Potassium: 3.7 mEq/L (ref 3.5–5.1)
Sodium: 140 mEq/L (ref 135–145)
Total Bilirubin: 1 mg/dL (ref 0.2–1.2)
Total Protein: 7.4 g/dL (ref 6.0–8.3)

## 2021-05-06 LAB — CBC WITH DIFFERENTIAL/PLATELET
Basophils Absolute: 0 10*3/uL (ref 0.0–0.1)
Basophils Relative: 0.8 % (ref 0.0–3.0)
Eosinophils Absolute: 0.3 10*3/uL (ref 0.0–0.7)
Eosinophils Relative: 4.3 % (ref 0.0–5.0)
HCT: 41.9 % (ref 36.0–46.0)
Hemoglobin: 14.1 g/dL (ref 12.0–15.0)
Lymphocytes Relative: 31.7 % (ref 12.0–46.0)
Lymphs Abs: 1.9 10*3/uL (ref 0.7–4.0)
MCHC: 33.6 g/dL (ref 30.0–36.0)
MCV: 83.7 fl (ref 78.0–100.0)
Monocytes Absolute: 0.5 10*3/uL (ref 0.1–1.0)
Monocytes Relative: 8.6 % (ref 3.0–12.0)
Neutro Abs: 3.2 10*3/uL (ref 1.4–7.7)
Neutrophils Relative %: 54.6 % (ref 43.0–77.0)
Platelets: 212 10*3/uL (ref 150.0–400.0)
RBC: 5.01 Mil/uL (ref 3.87–5.11)
RDW: 14 % (ref 11.5–15.5)
WBC: 5.9 10*3/uL (ref 4.0–10.5)

## 2021-05-06 LAB — SEDIMENTATION RATE: Sed Rate: 39 mm/hr — ABNORMAL HIGH (ref 0–30)

## 2021-05-06 LAB — C-REACTIVE PROTEIN: CRP: <1 mg/dL (ref 0.5–20.0)

## 2021-05-06 NOTE — Assessment & Plan Note (Signed)
Chronic Increased knee pain ? Related to rash or not Ck autoimmune labs - esr, crp, ccp, rf, ana

## 2021-05-06 NOTE — Assessment & Plan Note (Signed)
Subacute Also with some SOB ? Related to recent illness May be allergies - start zyrtec Discussed possibility of cardiac cause - had neg stress tests in 2019 - will try allergy medication - if no improvement will consider referral to cardio

## 2021-05-06 NOTE — Patient Instructions (Addendum)
°  ° °  Have blood work done today.    Medications changes include :   take zyrtec at night.    If your symptoms are not getting better please let me know.

## 2021-05-06 NOTE — Assessment & Plan Note (Signed)
Chronic BP well controlled Continue amlodipine-olmesartan 5-40 mg daily

## 2021-05-06 NOTE — Assessment & Plan Note (Signed)
Subacute Start zyrtec daily

## 2021-05-06 NOTE — Assessment & Plan Note (Signed)
Acute - started 4 days ago Non itchy, not painful improving ? Related to antibiotic - allergic  - stopped it ? Vasculitis - check esr, crp, ccp, ana, rf Start zyrtec nightly  Call if no improvement

## 2021-05-08 LAB — RHEUMATOID FACTOR: Rheumatoid fact SerPl-aCnc: <14 IU/mL (ref <14)

## 2021-05-08 LAB — CYCLIC CITRUL PEPTIDE ANTIBODY, IGG: Cyclic Citrullin Peptide Ab: <16 UNITS

## 2021-05-08 LAB — ANA: Anti Nuclear Antibody (ANA): NEGATIVE

## 2021-05-13 ENCOUNTER — Encounter: Payer: Self-pay | Admitting: Internal Medicine

## 2021-05-17 ENCOUNTER — Ambulatory Visit
Admission: RE | Admit: 2021-05-17 | Discharge: 2021-05-17 | Disposition: A | Discharge status: Home / Self Care | Payer: Medicare Other | Source: Ambulatory Visit | Attending: Internal Medicine | Admitting: Internal Medicine

## 2021-05-17 DIAGNOSIS — Z1231 Encounter for screening mammogram for malignant neoplasm of breast: Secondary | ICD-10-CM | POA: Diagnosis not present

## 2021-06-11 IMAGING — MR MR SHOULDER*R* W/O CM
5 series · 40 of 40 positions shown · non-contrast
Comparison: Radiographs 01/05/2020.

CLINICAL DATA: Shoulder pain with limited range of motion following
injury 2 months ago. Adhesive capsulitis. No previous relevant
surgery.

EXAM:
MRI OF THE RIGHT SHOULDER WITHOUT CONTRAST
TECHNIQUE: Multiplanar, multisequence MR imaging of the shoulder was performed.
No intravenous contrast was administered.

[Series 6: T2 fat-sat · axial · 4.0mm · 0.59mm/px · z∈[+17,+94]mm · 9 of 22 slices shown (1 of 3)]
[im 1/22]
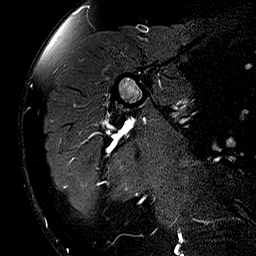
[im 3/22]
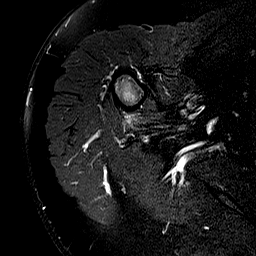
[im 6/22]
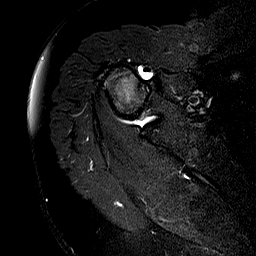
[im 8/22]
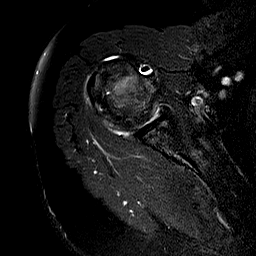
[im 11/22]
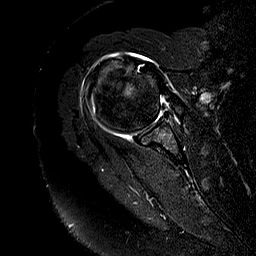
[im 14/22]
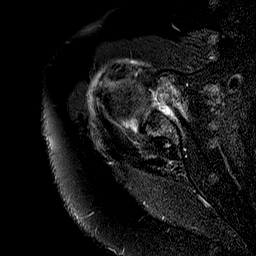
[im 16/22]
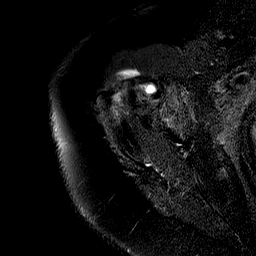
[im 19/22]
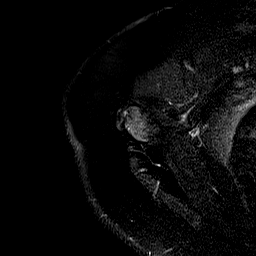
[im 22/22]
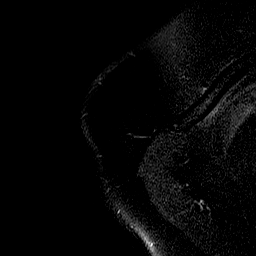

[Series 7: T2 fat-sat · oblique · 4.0mm · 0.59mm/px · 8 of 19 slices shown (2 of 3)]
[im 1/19]
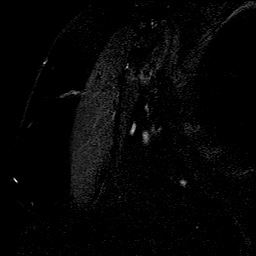
[im 3/19]
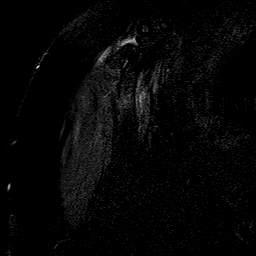
[im 6/19]
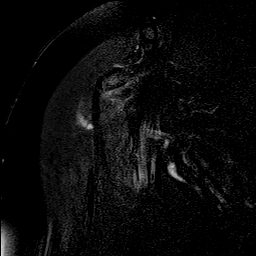
[im 8/19]
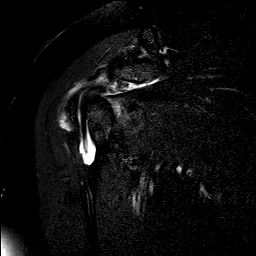
[im 11/19]
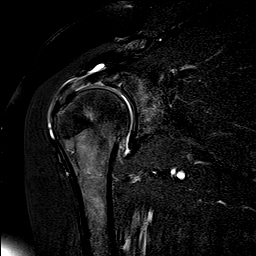
[im 13/19]
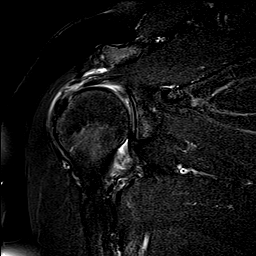
[im 16/19]
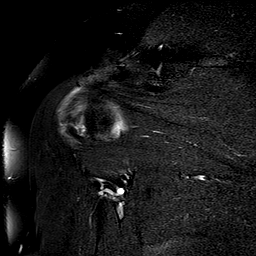
[im 19/19]
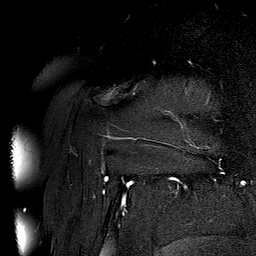

[Series 8: PD fat-sat · oblique · 4.0mm · 0.59mm/px · 7 of 19 slices shown]
[im 1/19]
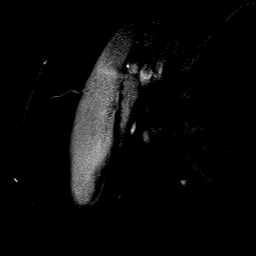
[im 4/19]
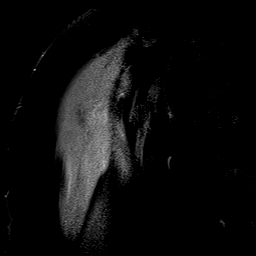
[im 7/19]
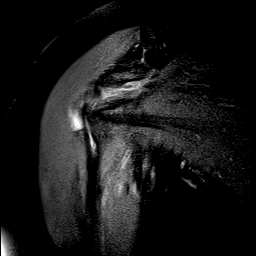
[im 10/19]
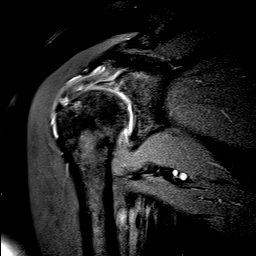
[im 13/19]
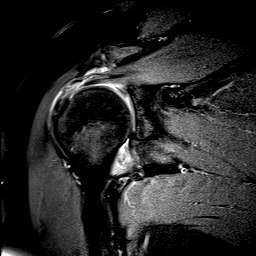
[im 16/19]
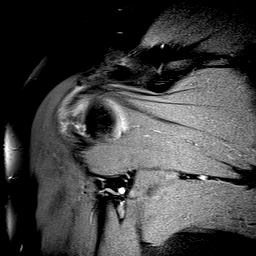
[im 19/19]
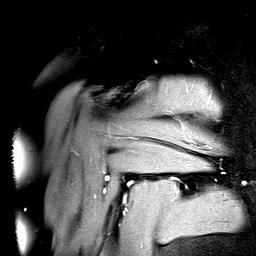

[Series 9: T1 · oblique · 4.0mm · 0.59mm/px · 8 of 22 slices shown]
[im 1/22]
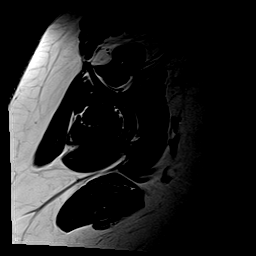
[im 4/22]
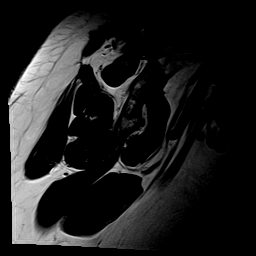
[im 7/22]
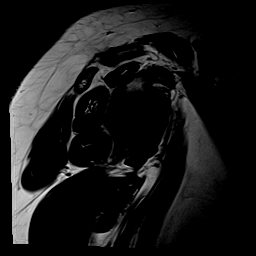
[im 10/22]
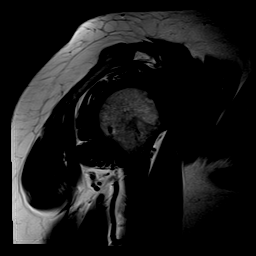
[im 13/22]
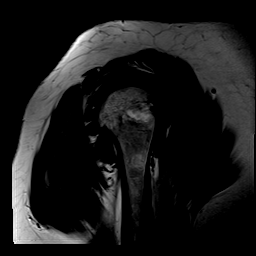
[im 16/22]
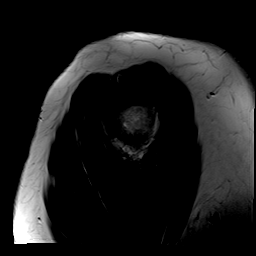
[im 19/22]
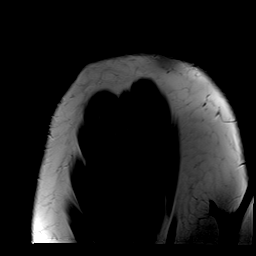
[im 22/22]
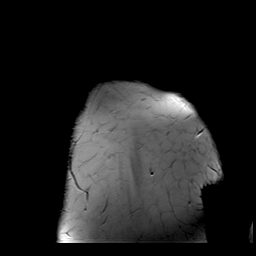

[Series 10: T2 fat-sat · oblique · 4.0mm · 0.59mm/px · 8 of 22 slices shown (3 of 3)]
[im 1/22]
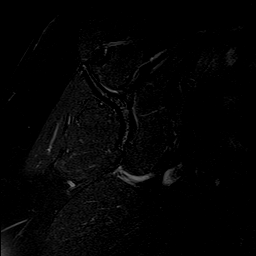
[im 4/22]
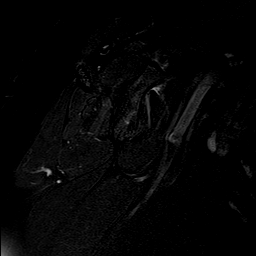
[im 7/22]
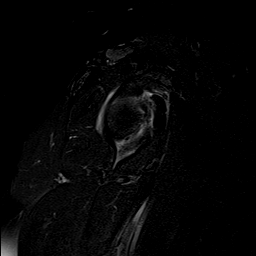
[im 10/22]
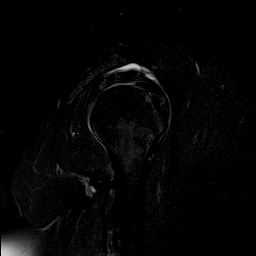
[im 13/22]
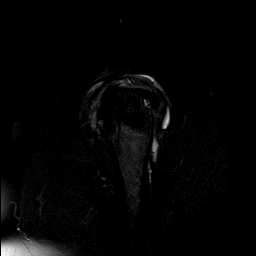
[im 16/22]
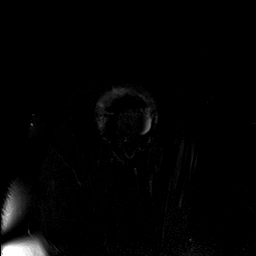
[im 19/22]
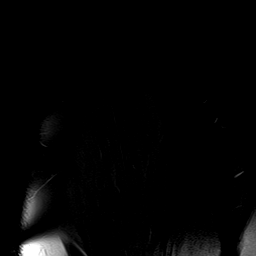
[im 22/22]
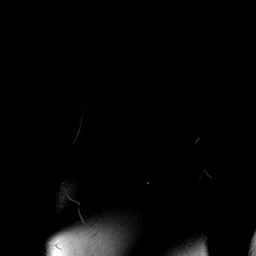

[40 of 40 positions shown; findings below may reference images not displayed]

FINDINGS: Rotator cuff: Mild supraspinatus tendinosis with small focus of
partial intrasubstance insertional tearing, best seen on sagittal
image [DATE]. The infraspinatus tendon also demonstrates tendinosis
and partial intrasubstance tearing in the critical zone. No
full-thickness tendon tear or tendon retraction. The subscapularis
and teres minor tendons appear normal.

Muscles:  No focal muscular atrophy or edema.

Biceps long head:  Intact and normally positioned.

Acromioclavicular Joint: The acromion is type 1. There are mild
acromioclavicular degenerative changes. There is a small amount of
fluid in the subacromial-subdeltoid bursa.

Glenohumeral Joint: Mild glenohumeral degenerative changes. No
significant shoulder joint effusion. There is joint capsular
thickening and edema within the axillary recess consistent with
adhesive capsulitis. There is lesser involvement of the rotator
interval.

Labrum: Mild labral degeneration without discrete tear or paralabral
cyst.

Bones: No acute or significant extra-articular osseous findings.

Other: No significant soft tissue findings.
IMPRESSION: 1. Joint capsular thickening and edema within the axillary recess
consistent with adhesive capsulitis.
2. Mild supraspinatus and infraspinatus tendinosis with small foci
of partial intrasubstance tearing as described. No full-thickness
rotator cuff tear or tendon retraction.
3. The biceps tendon and labrum appear intact.
4. Mild glenohumeral and acromioclavicular degenerative changes.
Mild subacromial-subdeltoid bursitis.

## 2021-08-25 NOTE — Progress Notes (Signed)
? ? ? ? ?Subjective:  ? ? Patient ID: Alicia Hunt, female    DOB: [birth year], 66 y.o.   MRN: [id number] ? ?This visit occurred during the SARS-CoV-2 public health emergency.  Safety protocols were in place, including screening questions prior to the visit, additional usage of staff PPE, and extensive cleaning of exam room while observing appropriate contact time as indicated for disinfecting solutions.   ? ? ?HPI ?Alicia Hunt is here for follow up of her chronic medical problems, including htn, hypothyroid, hld, prediabetes, depression, vit d and b12 def ? ?She is walking 3-4 times a week.  She is active.  She is frustrated by her inability to lose weight.  She really feels her weight is affecting her overall health. ? ?She lifted something and injured her right shoulder - it is tight -she has been experiencing muscle spasms.  Heat and muscle rub has helped, but she still having tightness.   ? ? ? ? ?Medications and allergies reviewed with patient and updated if appropriate. ? ?Current Outpatient Medications on File Prior to Visit  ?Medication Sig Dispense Refill  ? amLODipine-olmesartan (AZOR) 5-40 MG tablet TAKE 1 TABLET BY MOUTH  DAILY 90 tablet 3  ? atorvastatin (LIPITOR) 10 MG tablet TAKE 1 TABLET BY MOUTH  DAILY 90 tablet 3  ? escitalopram (LEXAPRO) 10 MG tablet TAKE 1 TABLET BY MOUTH  DAILY 90 tablet 3  ? famotidine (PEPCID) 20 MG tablet Take 20 mg by mouth as needed for heartburn or indigestion.    ? levothyroxine (SYNTHROID) 50 MCG tablet TAKE 1 TABLET BY MOUTH  DAILY 90 tablet 3  ? Multiple Vitamin (MULTIVITAMIN) capsule Take 1 capsule by mouth daily.    ? traMADol-acetaminophen (ULTRACET) 37.5-325 MG tablet Take 1 tablet by mouth every 6 (six) hours as needed.    ? ?No current facility-administered medications on file prior to visit.  ? ? ? ?Review of Systems  ?Constitutional:  Negative for chills and fever.  ?Respiratory:  Negative for cough, shortness of breath and wheezing.   ?Cardiovascular:   Negative for chest pain, palpitations and leg swelling.  ?Neurological:  Positive for dizziness (mild at times) and numbness (Mild numbness/tingling in toes intermittently-concerns related to her prediabetes). Negative for light-headedness and headaches.  ? ?   ?Objective:  ? ?Vitals:  ? 08/26/21 1050  ?BP: 132/82  ?Pulse: (!) 56  ?Temp: 98 ?F (36.7 ?C)  ?SpO2: 96%  ? ?BP Readings from Last 3 Encounters:  ?08/26/21 132/82  ?05/06/21 128/74  ?04/26/21 120/82  ? ?Wt Readings from Last 3 Encounters:  ?08/26/21 211 lb 8 oz (95.9 kg)  ?05/06/21 211 lb 3.2 oz (95.8 kg)  ?04/26/21 212 lb (96.2 kg)  ? ?Body mass index is 37.47 kg/m?. ? ?  ?Physical Exam ?Constitutional:   ?   General: She is not in acute distress. ?   Appearance: Normal appearance.  ?HENT:  ?   Head: Normocephalic and atraumatic.  ?Eyes:  ?   Conjunctiva/sclera: Conjunctivae normal.  ?Cardiovascular:  ?   Rate and Rhythm: Normal rate and regular rhythm.  ?   Heart sounds: Normal heart sounds. No murmur heard. ?Pulmonary:  ?   Effort: Pulmonary effort is normal. No respiratory distress.  ?   Breath sounds: Normal breath sounds. No wheezing.  ?Musculoskeletal:  ?   Cervical back: Neck supple.  ?   Right lower leg: No edema.  ?   Left lower leg: No edema.  ?Lymphadenopathy:  ?   Cervical: No  cervical adenopathy.  ?Skin: ?   Findings: No rash.  ?Neurological:  ?   Mental Status: She is alert. Mental status is at baseline.  ?   Sensory: No sensory deficit.  ?Psychiatric:     ?   Mood and Affect: Mood normal.     ?   Behavior: Behavior normal.  ? ?   ? ?Lab Results  ?Component Value Date  ? WBC 5.9 05/06/2021  ? HGB 14.1 05/06/2021  ? HCT 41.9 05/06/2021  ? PLT 212.0 05/06/2021  ? GLUCOSE 100 (H) 05/06/2021  ? CHOL 135 02/20/2021  ? TRIG 129.0 02/20/2021  ? HDL 44.70 02/20/2021  ? LDLDIRECT 79.0 11/12/2020  ? LDLCALC 65 02/20/2021  ? ALT 29 05/06/2021  ? AST 23 05/06/2021  ? NA 140 05/06/2021  ? K 3.7 05/06/2021  ? CL 103 05/06/2021  ? CREATININE 0.76 05/06/2021   ? BUN 11 05/06/2021  ? CO2 28 05/06/2021  ? TSH 2.27 02/20/2021  ? HGBA1C 6.2 02/20/2021  ? ? ? ?Assessment & Plan:  ? ? ?See Problem List for Assessment and Plan of chronic medical problems.  ? ? ?

## 2021-08-25 NOTE — Patient Instructions (Addendum)
? ? ? ?  Blood work was ordered.   ? ? ?Medications changes include :   ozempic 0.25 mg once a week for 4 weeks then 0.5 mg weekly.  Inject in the thighs, abdomen, upper arms. ? ? ?Your prescription(s) have been sent to your pharmacy.  ? ? ? ?Return in about 6 months (around 02/25/2022) for CPE. ? ?

## 2021-08-26 ENCOUNTER — Ambulatory Visit: Payer: Medicare Other | Admitting: Internal Medicine

## 2021-08-26 ENCOUNTER — Other Ambulatory Visit: Payer: Self-pay | Admitting: *Deleted

## 2021-08-26 ENCOUNTER — Ambulatory Visit (INDEPENDENT_AMBULATORY_CARE_PROVIDER_SITE_OTHER): Payer: Medicare Other | Admitting: Internal Medicine

## 2021-08-26 ENCOUNTER — Encounter: Payer: Self-pay | Admitting: Internal Medicine

## 2021-08-26 VITALS — BP 132/82 | HR 56 | Temp 98.0°F | Ht 63.0 in | Wt 211.5 lb

## 2021-08-26 DIAGNOSIS — E559 Vitamin D deficiency, unspecified: Secondary | ICD-10-CM

## 2021-08-26 DIAGNOSIS — E039 Hypothyroidism, unspecified: Secondary | ICD-10-CM

## 2021-08-26 DIAGNOSIS — F3289 Other specified depressive episodes: Secondary | ICD-10-CM

## 2021-08-26 DIAGNOSIS — S29012A Strain of muscle and tendon of back wall of thorax, initial encounter: Secondary | ICD-10-CM | POA: Insufficient documentation

## 2021-08-26 DIAGNOSIS — Z1211 Encounter for screening for malignant neoplasm of colon: Secondary | ICD-10-CM | POA: Diagnosis not present

## 2021-08-26 DIAGNOSIS — R7303 Prediabetes: Secondary | ICD-10-CM

## 2021-08-26 DIAGNOSIS — E7849 Other hyperlipidemia: Secondary | ICD-10-CM | POA: Diagnosis not present

## 2021-08-26 DIAGNOSIS — E538 Deficiency of other specified B group vitamins: Secondary | ICD-10-CM

## 2021-08-26 DIAGNOSIS — I10 Essential (primary) hypertension: Secondary | ICD-10-CM

## 2021-08-26 LAB — COMPREHENSIVE METABOLIC PANEL
ALT: 30 U/L (ref 0–35)
AST: 25 U/L (ref 0–37)
Albumin: 4.1 g/dL (ref 3.5–5.2)
Alkaline Phosphatase: 90 U/L (ref 39–117)
BUN: 11 mg/dL (ref 6–23)
CO2: 29 mEq/L (ref 19–32)
Calcium: 9.3 mg/dL (ref 8.4–10.5)
Chloride: 104 mEq/L (ref 96–112)
Creatinine, Ser: 0.78 mg/dL (ref 0.40–1.20)
GFR: 78.37 mL/min (ref >60.00)
Glucose, Bld: 104 mg/dL — ABNORMAL HIGH (ref 70–99)
Potassium: 4.3 mEq/L (ref 3.5–5.1)
Sodium: 141 mEq/L (ref 135–145)
Total Bilirubin: 1 mg/dL (ref 0.2–1.2)
Total Protein: 7.3 g/dL (ref 6.0–8.3)

## 2021-08-26 LAB — LIPID PANEL
Cholesterol: 132 mg/dL (ref 0–200)
HDL: 45.1 mg/dL (ref >39.00)
LDL Cholesterol: 59 mg/dL (ref 0–99)
NonHDL: 86.72
Total CHOL/HDL Ratio: 3
Triglycerides: 140 mg/dL (ref 0.0–149.0)
VLDL: 28 mg/dL (ref 0.0–40.0)

## 2021-08-26 LAB — HEMOGLOBIN A1C: Hgb A1c MFr Bld: 6.3 % (ref 4.6–6.5)

## 2021-08-26 LAB — VITAMIN D 25 HYDROXY (VIT D DEFICIENCY, FRACTURES): VITD: 27.81 ng/mL — ABNORMAL LOW (ref 30.00–100.00)

## 2021-08-26 LAB — TSH: TSH: 4.14 u[IU]/mL (ref 0.35–5.50)

## 2021-08-26 LAB — VITAMIN B12: Vitamin B-12: 593 pg/mL (ref 211–911)

## 2021-08-26 MED ORDER — OZEMPIC (0.25 OR 0.5 MG/DOSE) 2 MG/1.5ML ~~LOC~~ SOPN: PEN_INJECTOR | SUBCUTANEOUS | 0 refills | Status: DC

## 2021-08-26 MED ORDER — METHOCARBAMOL 500 MG PO TABS
500.0000 mg | ORAL_TABLET | Freq: Three times a day (TID) | ORAL | 1 refills | Status: DC | PRN
Start: 2021-08-26 — End: 2021-08-26

## 2021-08-26 NOTE — Assessment & Plan Note (Signed)
Chronic Regular exercise and healthy diet encouraged Check lipid panel  Continue atorvastatin 10 mg daily 

## 2021-08-26 NOTE — Assessment & Plan Note (Signed)
Chronic ?High risk for becoming diabetic ?Check a1c ?Low sugar / carb diet ?Continue regular exercise ?Working on weight loss ?Start Ozempic 0.25 mg weekly x4 weeks, then 0.5 mg weekly ?

## 2021-08-26 NOTE — Assessment & Plan Note (Signed)
Chronic  Clinically euthyroid Currently taking levothyroxine 50 mcg daily Check tsh  Titrate med dose if needed  

## 2021-08-26 NOTE — Assessment & Plan Note (Signed)
Chronic ?Check B12 level ?

## 2021-08-26 NOTE — Assessment & Plan Note (Addendum)
Chronic Blood pressure well controlled CMP Continue amlodipine-olmesartan 5-40 mg daily 

## 2021-08-26 NOTE — Assessment & Plan Note (Signed)
Chronic Controlled, Stable Continue Lexapro 10 mg daily 

## 2021-08-26 NOTE — Assessment & Plan Note (Signed)
Chronic Taking vitamin D daily Check vitamin D level  

## 2021-08-26 NOTE — Telephone Encounter (Signed)
Patient called and states her RX needs to be sent to [facility]. Patient is requesting a 90 day supply of her Robaxin. Please advise if ok to resend rx.  ?

## 2021-08-26 NOTE — Assessment & Plan Note (Signed)
Acute ?Related to lifting something heavy ?Continue heat, muscle rub ?Start methocarbamol 500 mg 3 times daily as needed ?

## 2021-08-27 MED ORDER — METHOCARBAMOL 500 MG PO TABS
500.0000 mg | ORAL_TABLET | Freq: Three times a day (TID) | ORAL | 1 refills | Status: DC | PRN
Start: 2021-08-27 — End: 2021-12-17

## 2021-08-27 MED ORDER — OZEMPIC (0.25 OR 0.5 MG/DOSE) 2 MG/1.5ML ~~LOC~~ SOPN
PEN_INJECTOR | SUBCUTANEOUS | 0 refills | Status: DC
Start: 2021-08-27 — End: 2021-09-16

## 2021-09-15 ENCOUNTER — Other Ambulatory Visit: Payer: Self-pay | Admitting: Internal Medicine

## 2021-09-16 ENCOUNTER — Ambulatory Visit: Payer: Self-pay

## 2021-09-16 ENCOUNTER — Ambulatory Visit: Payer: Medicare Other | Admitting: Family Medicine

## 2021-09-16 ENCOUNTER — Ambulatory Visit (INDEPENDENT_AMBULATORY_CARE_PROVIDER_SITE_OTHER): Payer: Medicare Other

## 2021-09-16 VITALS — BP 164/112 | HR 63 | Ht 63.0 in | Wt 208.8 lb

## 2021-09-16 DIAGNOSIS — M5010 Cervical disc disorder with radiculopathy, unspecified cervical region: Secondary | ICD-10-CM

## 2021-09-16 DIAGNOSIS — G8929 Other chronic pain: Secondary | ICD-10-CM

## 2021-09-16 DIAGNOSIS — M25511 Pain in right shoulder: Secondary | ICD-10-CM | POA: Diagnosis not present

## 2021-09-16 DIAGNOSIS — M542 Cervicalgia: Secondary | ICD-10-CM | POA: Diagnosis not present

## 2021-09-16 MED ORDER — PREDNISONE 50 MG PO TABS: ORAL_TABLET | ORAL | 0 refills | Status: DC

## 2021-09-16 MED ORDER — GABAPENTIN 300 MG PO CAPS: 300.0000 mg | ORAL_CAPSULE | Freq: Three times a day (TID) | ORAL | 2 refills | Status: DC

## 2021-09-16 NOTE — Patient Instructions (Addendum)
Thank you for coming in today.  ? ?I've sent a prescription for Gabapentin and Prednisone to your pharmacy.  ? ?I've referred you to Physical Therapy.  Let us know if you don't hear from them in one week.  ? ?Please get an Xray today before you leave  ? ?Recheck back in 6 weeks. ?
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
## 2021-09-17 NOTE — Progress Notes (Signed)
Cervical spine x-rays show some arthritis changes.  No fractures are visible.

## 2021-09-17 NOTE — Progress Notes (Signed)
Right shoulder x-ray shows some arthritis changes.

## 2021-09-19 ENCOUNTER — Encounter: Payer: Self-pay | Admitting: Internal Medicine
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
## 2021-09-24 ENCOUNTER — Ambulatory Visit: Payer: Medicare Other | Attending: Family Medicine | Admitting: Physical Therapy

## 2021-09-24 DIAGNOSIS — M542 Cervicalgia: Secondary | ICD-10-CM | POA: Diagnosis not present

## 2021-09-24 DIAGNOSIS — G8929 Other chronic pain: Secondary | ICD-10-CM | POA: Diagnosis not present

## 2021-09-24 DIAGNOSIS — M6281 Muscle weakness (generalized): Secondary | ICD-10-CM | POA: Diagnosis not present

## 2021-09-24 DIAGNOSIS — M25611 Stiffness of right shoulder, not elsewhere classified: Secondary | ICD-10-CM | POA: Insufficient documentation

## 2021-09-24 DIAGNOSIS — M25511 Pain in right shoulder: Secondary | ICD-10-CM | POA: Insufficient documentation

## 2021-09-24 DIAGNOSIS — M5010 Cervical disc disorder with radiculopathy, unspecified cervical region: Secondary | ICD-10-CM | POA: Insufficient documentation

## 2021-09-24 NOTE — Patient Instructions (Addendum)
Access Code: [email or web address] ?URL: [email or web address] ?Date: 09/24/2021 ?Prepared by: Garen Lah ? ?Exercises ?- Supine Deep Neck Flexor Training  - 2-3 x daily - 7 x weekly - 10 reps - 3 hold ?- Seated Cervical Retraction Protraction AROM  - 2-3 x daily - 7 x weekly - 1 sets - 10 reps ?- Seated Gentle Upper Trapezius Stretch  - 1 x daily - 7 x weekly - 1 sets - 3 reps - 30 hold ?- Gentle Levator Scapulae Stretch  - 1 x daily - 7 x weekly - 3 sets - 3 reps - 30 hold ? ?Posture Tips ?DO: - stand tall and erect - keep chin tucked in - keep head and shoulders in alignment - check posture regularly in mirror or large window - pull head back against headrest in car seat;  Change your position often.  Sit with lumbar support. ?DON'T: - slouch or slump while watching TV or reading - sit, stand or lie in one position  for too long;  Sitting is especially hard on the spine so if you sit at a desk/use the computer, then stand up often! ? ? ?Copyright ? VHI. All rights reserved.  ?Posture - Standing ? ? ?Good posture is important. Avoid slouching and forward head thrust. Maintain curve in low back and align ears over shoul- ders, hips over ankles.  Pull your belly button in toward your back bone. Stand on even feet  ? ? ?Copyright ? VHI. All rights reserved.  ?Posture - Sitting ? ? ?Sit upright, head facing forward. Try using a roll to support lower back. Keep shoulders relaxed, and avoid rounded back. Keep hips level with knees. Avoid crossing legs for long periods. Sit on sit bones and not tail bone. Dont cross legs ? ? ?Copyright ? VHI. All rights reserved.   ? ?Garen Lah, PT, ATRIC ?Certified Exercise Expert for the Aging Adult  ?09/24/21 10:58 AM ?Phone: [phone] ?Fax: [phone]  ?
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
## 2021-09-26 ENCOUNTER — Ambulatory Visit: Payer: Medicare Other | Admitting: Physical Therapy

## 2021-09-26 DIAGNOSIS — M5010 Cervical disc disorder with radiculopathy, unspecified cervical region: Secondary | ICD-10-CM | POA: Diagnosis not present

## 2021-09-26 DIAGNOSIS — M25511 Pain in right shoulder: Secondary | ICD-10-CM

## 2021-09-26 DIAGNOSIS — M6281 Muscle weakness (generalized): Secondary | ICD-10-CM

## 2021-09-26 DIAGNOSIS — M542 Cervicalgia: Secondary | ICD-10-CM | POA: Diagnosis not present

## 2021-09-26 DIAGNOSIS — M25611 Stiffness of right shoulder, not elsewhere classified: Secondary | ICD-10-CM

## 2021-09-26 DIAGNOSIS — G8929 Other chronic pain: Secondary | ICD-10-CM | POA: Diagnosis not present

## 2021-09-26 NOTE — Patient Instructions (Addendum)
?  Trigger Point Dry Needling ? ?What is Trigger Point Dry Needling (DN)? ?DN is a physical therapy technique used to treat muscle pain and dysfunction. Specifically, DN helps deactivate muscle trigger points (muscle knots).  ?A thin filiform needle is used to penetrate the skin and stimulate the underlying trigger point. The goal is for a local twitch response (LTR) to occur and for the trigger point to relax. No medication of any kind is injected during the procedure.  ? ?What Does Trigger Point Dry Needling Feel Like?  ?The procedure feels different for each individual patient. Some patients report that they do not actually feel the needle enter the skin and overall the process is not painful. Very mild bleeding may occur. However, many patients feel a deep cramping in the muscle in which the needle was inserted. This is the local twitch response.  ? ?How Will I feel after the treatment? ?Soreness is normal, and the onset of soreness may not occur for a few hours. Typically this soreness does not last longer than two days.  ?Bruising is uncommon, however; ice can be used to decrease any possible bruising.  ?In rare cases feeling tired or nauseous after the treatment is normal. In addition, your symptoms may get worse before they get better, this period will typically not last longer than 24 hours.  ? ?What Can I do After My Treatment? ?Increase your hydration by drinking more water for the next 24 hours. ?You may place ice or heat on the areas treated that have become sore, however, do not use heat on inflamed or bruised areas. Heat often brings more relief post needling. ?You can continue your regular activities, but vigorous activity is not recommended initially after the treatment for 24 hours. ?DN is best combined with other physical therapy such as strengthening, stretching, and other therapies.   ? ? ? ?Garen Lah, PT, ATRIC ?Certified Exercise Expert for the Aging Adult  ?09/26/21 11:13 AM ?Phone:  [phone] ?Fax: [phone]  ?

## 2021-10-01 ENCOUNTER — Other Ambulatory Visit: Payer: Self-pay | Admitting: Internal Medicine

## 2021-10-02 ENCOUNTER — Encounter: Payer: Self-pay | Admitting: Physical Therapy

## 2021-10-02 ENCOUNTER — Ambulatory Visit: Payer: Medicare Other | Admitting: Physical Therapy

## 2021-10-02 DIAGNOSIS — M542 Cervicalgia: Secondary | ICD-10-CM

## 2021-10-02 DIAGNOSIS — M6281 Muscle weakness (generalized): Secondary | ICD-10-CM

## 2021-10-02 DIAGNOSIS — M25511 Pain in right shoulder: Secondary | ICD-10-CM | POA: Diagnosis not present

## 2021-10-02 DIAGNOSIS — G8929 Other chronic pain: Secondary | ICD-10-CM | POA: Diagnosis not present

## 2021-10-02 DIAGNOSIS — M25611 Stiffness of right shoulder, not elsewhere classified: Secondary | ICD-10-CM | POA: Diagnosis not present

## 2021-10-02 DIAGNOSIS — M5010 Cervical disc disorder with radiculopathy, unspecified cervical region: Secondary | ICD-10-CM | POA: Diagnosis not present
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
## 2021-10-07 ENCOUNTER — Ambulatory Visit: Payer: Medicare Other | Admitting: Physical Therapy

## 2021-10-07 ENCOUNTER — Encounter: Payer: Self-pay | Admitting: Physical Therapy

## 2021-10-07 DIAGNOSIS — G8929 Other chronic pain: Secondary | ICD-10-CM | POA: Diagnosis not present

## 2021-10-07 DIAGNOSIS — M25611 Stiffness of right shoulder, not elsewhere classified: Secondary | ICD-10-CM | POA: Diagnosis not present

## 2021-10-07 DIAGNOSIS — M6281 Muscle weakness (generalized): Secondary | ICD-10-CM | POA: Diagnosis not present

## 2021-10-07 DIAGNOSIS — M25511 Pain in right shoulder: Secondary | ICD-10-CM | POA: Diagnosis not present

## 2021-10-07 DIAGNOSIS — M5010 Cervical disc disorder with radiculopathy, unspecified cervical region: Secondary | ICD-10-CM | POA: Diagnosis not present

## 2021-10-07 DIAGNOSIS — M542 Cervicalgia: Secondary | ICD-10-CM
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
## 2021-10-09 ENCOUNTER — Ambulatory Visit: Payer: Medicare Other | Admitting: Physical Therapy

## 2021-10-09 ENCOUNTER — Encounter: Payer: Self-pay | Admitting: Physical Therapy

## 2021-10-09 DIAGNOSIS — G8929 Other chronic pain: Secondary | ICD-10-CM | POA: Diagnosis not present

## 2021-10-09 DIAGNOSIS — M25611 Stiffness of right shoulder, not elsewhere classified: Secondary | ICD-10-CM | POA: Diagnosis not present

## 2021-10-09 DIAGNOSIS — M542 Cervicalgia: Secondary | ICD-10-CM | POA: Diagnosis not present

## 2021-10-09 DIAGNOSIS — M6281 Muscle weakness (generalized): Secondary | ICD-10-CM | POA: Diagnosis not present

## 2021-10-09 DIAGNOSIS — M25511 Pain in right shoulder: Secondary | ICD-10-CM | POA: Diagnosis not present

## 2021-10-09 DIAGNOSIS — M5010 Cervical disc disorder with radiculopathy, unspecified cervical region: Secondary | ICD-10-CM | POA: Diagnosis not present
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
## 2021-10-15 ENCOUNTER — Encounter: Payer: Self-pay | Admitting: Physical Therapy

## 2021-10-15 ENCOUNTER — Ambulatory Visit: Payer: Medicare Other | Admitting: Physical Therapy

## 2021-10-15 DIAGNOSIS — M6281 Muscle weakness (generalized): Secondary | ICD-10-CM

## 2021-10-15 DIAGNOSIS — M25511 Pain in right shoulder: Secondary | ICD-10-CM | POA: Diagnosis not present

## 2021-10-15 DIAGNOSIS — M25611 Stiffness of right shoulder, not elsewhere classified: Secondary | ICD-10-CM

## 2021-10-15 DIAGNOSIS — M542 Cervicalgia: Secondary | ICD-10-CM

## 2021-10-15 DIAGNOSIS — M5010 Cervical disc disorder with radiculopathy, unspecified cervical region: Secondary | ICD-10-CM | POA: Diagnosis not present

## 2021-10-15 DIAGNOSIS — G8929 Other chronic pain: Secondary | ICD-10-CM | POA: Diagnosis not present

## 2021-10-15 NOTE — Patient Instructions (Signed)
Access Code: [email or web address] URL: [email or web address] Date: 10/15/2021 Prepared by: Garen Lah  Exercises - Supine Deep Neck Flexor Training  - 2-3 x daily - 7 x weekly - 10 reps - 3 hold - Seated Cervical Retraction Protraction AROM  - 2-3 x daily - 7 x weekly - 1 sets - 10 reps - Seated Gentle Upper Trapezius Stretch  - 1 x daily - 7 x weekly - 1 sets - 3 reps - 30 hold - Gentle Levator Scapulae Stretch  - 1 x daily - 7 x weekly - 3 sets - 3 reps - 30 hold - Standing Shoulder Diagonal Horizontal Abduction 60/120 Degrees with Resistance  - 1 x daily - 7 x weekly - 3 sets - 10 reps - Standing Shoulder Horizontal Abduction with Resistance  - 1 x daily - 7 x weekly - 3 sets - 10 reps - Doorway Pec Stretch at 60 Elevation  - 1 x daily - 7 x weekly - 1 sets - 5 reps - 10 hold - Standing Row with Anchored Resistance  - 1 x daily - 7 x weekly - 3 sets - 10 reps - Shoulder External Rotation and Scapular Retraction with Resistance  - 1 x daily - 7 x weekly - 3 sets - 10 reps - Shoulder Overhead Press in Abduction with Dumbbells  - 1 x daily - 7 x weekly - 3 sets - 10 reps  Garen Lah, PT, [facility] Certified Exercise Expert for the Aging Adult  10/15/21 10:38 AM Phone: [phone] Fax: [phone]

## 2021-10-15 NOTE — Therapy (Signed)
OUTPATIENT PHYSICAL THERAPY TREATMENT NOTE   PHYSICAL THERAPY DISCHARGE SUMMARY  Visits from Start of Care: 6  Current functional level related to goals / functional outcomes: As indicated below.  Pt MET or partially met all goals.     Remaining deficits: Pt with 2/10 pain but much improved   Education / Equipment: HEP   Patient agrees to discharge. Patient goals were met. Patient is being discharged due to the patient's request.  And Mostly met all goals except partially met 2 goals Patient Name: Alicia Hunt MRN: [id number] DOB:[birth year], 66 y.o., female Today's Date: 10/15/2021  PCP: Binnie Rail MD REFERRING PROVIDER: Gregor Hams MD  END OF SESSION:   PT End of Session - 10/15/21 1019     Visit Number 6    Number of Visits 13    Date for PT Re-Evaluation 11/05/21    Authorization Type [place] 6th visit and 10th visit progress note    PT Start Time 1016    PT Stop Time 1100    PT Time Calculation (min) 44 min    Activity Tolerance Patient tolerated treatment well    Behavior During Therapy WFL for tasks assessed/performed              Past Medical History:  Diagnosis Date   Anxiety    CPAP (continuous positive airway pressure) dependence    Depression    Diverticulitis    Hyperlipidemia    Hypertension    Hypothyroidism    Sleep apnea    CPAP   Past Surgical History:  Procedure Laterality Date   ABDOMINAL HYSTERECTOMY     BREAST EXCISIONAL BIOPSY Left 20+ years   pt states dr didnt remove lump   CHOLECYSTECTOMY     Patient Active Problem List   Diagnosis Date Noted   Muscle strain of right upper back 08/26/2021   Rash and nonspecific skin eruption 05/06/2021   Arthralgia 05/06/2021   PND (post-nasal drip) 05/06/2021   Chest tightness 05/06/2021   URI (upper respiratory infection) 04/26/2021   Dizziness 04/25/2021   Osteopenia 03/16/2021   GERD (gastroesophageal reflux disease) 02/20/2021   Lump of skin  02/20/2021   Mild concussion 01/05/2020   Closed fracture of distal clavicle 01/05/2020   Right shoulder pain 01/05/2020   Head injury 01/05/2020   Constipation 11/15/2019   Right lower quadrant abdominal pain 11/15/2019   Lumbar radiculopathy 11/01/2019   Chest pain 09/13/2019   Burning sensation of feet 09/13/2019   OSA on CPAP 09/13/2019   Hyperuricemia 11/22/2018   Prediabetes 01/07/2018   Hypertension 01/06/2018   Hypothyroidism 01/06/2018   Vitamin D deficiency 01/06/2018   Low vitamin B12 level 01/06/2018   Depression 01/06/2018   Other hyperlipidemia 01/06/2018   H/O Diverticulitis of colon with bleeding 01/06/2018    REFERRING DIAG:  M25.511,G89.29 (ICD-10-CM) - Chronic right shoulder pain  M50.10 (ICD-10-CM) - Cervical disc disorder with radiculopathy of cervical region    Rationale for Evaluation and Treatment Rehabilitation   THERAPY DIAG:  Cervicalgia  Muscle weakness (generalized)  Right shoulder pain, unspecified chronicity  Acute pain of right shoulder  Stiffness of right shoulder, not elsewhere classified  PERTINENT HISTORY: CPAP machine sleep apnea , diverticulitis, depression/anxiety, HTN, Hypothyroidism, gall bladder removed  PRECAUTIONS: none  Rationale for Evaluation and Treatment Rehabilitation   SUBJECTIVE: My former PT /friend came this weekend and worked on me.  I am feeling better on prednisone and with the manual. The exercises helping my  strength. I now can sleep on my side better.    PAIN:   Are you having pain? Yes: NPRS scale: At rest pain 2/10 Pain location: R neck and upper shoulder Pain description: tingling elbow to hand Aggravating factors: bending forward and lifting pots and pans and even coffee cup, driving looking to right, I can't sleep on r or l side , can only sleep on back Relieving factors: Gabapentin at night Quilting and needlework and gardening  OBJECTIVE: (objective measures completed at initial evaluation  unless otherwise dated)     DIAGNOSTIC FINDINGS:  FINDINGS: 09-17-21 Degenerative changes of the acromioclavicular and glenohumeral joints are seen. No acute fracture or dislocation is noted. The underlying bony thorax appears within normal limits. No soft tissue changes are seen.   IMPRESSION: Degenerative change without acute abnormality. FINDINGS: 09-17-21 Seven cervical segments are well visualized. Vertebral body height is well maintained. Disc space narrowing is noted at C5-6 and C6-7 with associated osteophytic changes. Mild neural foraminal narrowing is noted at C5-6 bilaterally. No soft tissue abnormality is noted. The odontoid is within normal limits.   IMPRESSION: Mild degenerative change without acute abnormality.     PATIENT SURVEYS:  FOTO 50% predicted 65% 10-15-21 FOTO 66%     COGNITION: Overall cognitive status: Within functional limits for tasks assessed     SENSATION: WFL Pt does have radicular tingling down to R 5th digit   POSTURE:  Forward head R shld is more forward than left. Flexed torso   PALPATION: TTP R cervical and upper trap/levator with tightness       CERVICAL ROM:    Active ROM A/PROM (deg) 09/24/2021 A/PROM (Deg) 10-15-21  Flexion 25 with pain 32  Extension 40 with pain 55  Right lateral flexion 2 pain 30  Left lateral flexion 25 mild pain 26  Right rotation 40pain 55  Left rotation 45pain 60   (Blank rows = not tested)   UE ROM:   Active ROM Right 09/24/2021 Left 09/24/2021 Right 10-15-21  Shoulder flexion 150 161 162  Shoulder extension       Shoulder abduction 141 150 155  Shoulder adduction       Shoulder extension       Shoulder internal rotation 45 58 62  Shoulder external rotation 70 85 90  Elbow flexion       Elbow extension       Wrist flexion       Wrist extension       Wrist ulnar deviation       Wrist radial deviation       Wrist pronation       Wrist supination        (Blank rows = not tested)   UE MMT:    MMT Right 09/24/2021 Left 09/24/2021 RIGHT 10-15-21 Left 10-15-21  Shoulder flexion _0 Shoulder extension _1 Shoulder abduction 4+ 4+ 4+ 5  Shoulder adduction        Shoulder internal rotation 4+ 4+ 5 5  Shoulder external rotation 4 4 4+ 5  Middle trapezius        Lower trapezius        Elbow flexion        Elbow extension        Wrist flexion        Wrist extension        Wrist ulnar deviation        Wrist radial deviation  Wrist pronation        Wrist supination        Grip strength 40.6 42.3     (Blank rows = not tested)   CERVICAL SPECIAL TESTS:  Neck flexor muscle endurance test: Positive, Upper limb tension test (ULTT): Positive, and Spurling's test: Negative   Pt able to hold for 26 sec  but tingling abated  ULTT painful with testing FUNCTIONAL TESTS:  5 times sit to stand: 9.89 sec  normal 13 sec   PATIENT SURVEYS:  FOTO above   TODAY'S TREATMENT:  [facility] Adult PT Treatment:                                                DATE: 10-15-21 Therapeutic Exercise: ER bil 2 x 10  with GTB Overhead press with 10 # DB in R and L hand each 2 x 10 15 # Bil hold OH press 5 x 20# bil hold OH press 3 x  Standing median and radial nerve glides  Reviewed HEP verbally with questions Star pattern GTB 2 x 10 Manual Therapy: Soft tissue work to deltoid , posterior right shoulder in slide lying, IASTM Cervical distraction STW to R upper trap, scalenes, and occiptial release A/P and inferior glides Right shoulder PROM all planes- increased N/T with internal rotation SELF CARE - Pt educated on travel and continued progressive loading   [facility] Adult PT Treatment:                                                DATE: 10-09-21 Therapeutic Exercise: UBE L1 forward x 2 min each way  Standing median and radial nerve glides  Standing row Green 15 x 2  Standing shoulder extension green 10 x 2  Bilat shoulder ER red 15 x 2  Pec stretch door way 10 sec x 4  Forearm  protract/retract on wall - increased N/T so disc.  Standing red  Shoulder Diagonal Horizontal Abduction 60/120 Degrees with Resistance  3 sets - 10 reps Standing red  Shoulder Horizontal Abduction with Resistance  3 sets - 10 reps Manual Therapy:  Soft tissue work to deltoid , posterior right shoulder in slide lying, IASTM  A/P and inferior glides Right shoulder PROM all planes- increased N/T with internal rotation  [facility] Adult PT Treatment:                                                DATE: 10-07-21 Therapeutic Exercise: UBE L1 forward x 2 min each way   Pec stretch door way 10 sec x 4  Supine Shoulder Diagonal Horizontal Abduction 60/120 Degrees with Resistance  3 sets - 10 reps Supine Shoulder Horizontal Abduction with Resistance  3 sets - 10 reps Manual Therapy:  Soft tissue work to deltoid  Manual cervical traction.  Right shoulder PROM all planes- increased N/T with internal rotation   [facility] Adult PT Treatment:  DATE: 10-02-21 Therapeutic Exercise: UBE L1 forward x 2 min, back x 3 min- arm pain with forward  Standing row Green 15 x 2  Pec stretch door way 10 sec x 4  Standing Shoulder Diagonal Horizontal Abduction 60/120 Degrees with Resistance  3 sets - 10 reps Standing Shoulder Horizontal Abduction with Resistance  3 sets - 10 reps Supine Deep Neck Flexor Training  10 x 10 sec hold standing Cervical Retraction Protraction AROM  1 sets - 10 reps Seated Gentle Upper Trapezius Stretch  3 reps - 30 hold Gentle Levator Scapulae Stretch  3 reps - 30 hold    [facility] Adult PT Treatment:                                                DATE: 09-26-21 Therapeutic Exercise: Supine Deep Neck Flexor Training  10 x 10 sec hold Seated Cervical Retraction Protraction AROM  1 sets - 10 reps Seated Gentle Upper Trapezius Stretch  3 reps - 30 hold Gentle Levator Scapulae Stretch  3 reps - 30 hold Standing Shoulder Diagonal Horizontal Abduction 60/120  Degrees with Resistance  3 sets - 10 reps Standing Shoulder Horizontal Abduction with Resistance  3 sets - 10 reps  Manual Therapy STW-  Manual STW over bil upper trap  and Levator R rhomboid,teres and deltoid  CPA C-3 to -6 and UPA R C-3 to C-5, PA mobs over Thoracic  Left Rib thrust with cavitation  LAD or R UE  Suboccipital release Trigger Point Dry Needling Treatment: Pre-treatment instruction: Patient instructed on dry needling rationale, procedures, and possible side effects including pain during treatment (achy,cramping feeling), bruising, drop of blood, lightheadedness, nausea, sweating. Patient Consent Given: Yes Education handout provided: Yes Muscles treated: bil upper trap and levator, R rhomboid R deltoid C 3 t C-7 on R   Needle size and number: .30x8m x 3 and .25x321mx 4 Electrical stimulation performed: No Parameters: N/A Treatment response/outcome: Twitch response elicited and Palpable decrease in muscle tension Post-treatment instructions: Patient instructed to expect possible mild to moderate muscle soreness later today and/or tomorrow. Patient instructed in methods to reduce muscle soreness and to continue prescribed HEP. If patient was dry needled over the lung field, patient was instructed on signs and symptoms of pneumothorax and, however unlikely, to see immediate medical attention should they occur. Patient was also educated on signs and symptoms of infection and to seek medical attention should they occur. Patient verbalized understanding of these instructions and education.   Modalities: Moist hot pack to all mx TPDN      PATIENT EDUCATION:  Education details: POC, explanation of findings, FOTO report, HEP and posture initial Person educated: Patient Education method: Explanation, Demonstration, Tactile cues, Verbal cues, and Handouts Education comprehension: verbalized understanding, returned demonstration, verbal cues required, tactile cues required, and  needs further education     HOME EXERCISE PROGRAM: Access Code: [email or web address]: [email or web address] Date: 10/15/2021 Prepared by: [place]on[place]ille LoExercises - Supine Deep Neck Flexor Training  - 2-3 x daily - 7 x weekly - 10 reps - 3 hold - Seated Cervical Retraction Protraction AROM  - 2-3 x daily - 7 x weekly - 1 sets - 10 reps - Seated Gentle Upper Trapezius Stretch  - 1 x daily - 7 x weekly - 1 sets - 3 reps - 30 hold - Gentle Levator  Scapulae Stretch  - 1 x daily - 7 x weekly - 3 sets - 3 reps - 30 hold - Standing Shoulder Diagonal Horizontal Abduction 60/120 Degrees with Resistance  - 1 x daily - 7 x weekly - 3 sets - 10 reps - Standing Shoulder Horizontal Abduction with Resistance  - 1 x daily - 7 x weekly - 3 sets - 10 reps - Doorway Pec Stretch at 60 Elevation  - 1 x daily - 7 x weekly - 1 sets - 5 reps - 10 hold - Standing Row with Anchored Resistance  - 1 x daily - 7 x weekly - 3 sets - 10 reps - Shoulder External Rotation and Scapular Retraction with Resistance  - 1 x daily - 7 x weekly - 3 sets - 10 reps - Shoulder Overhead Press in Abduction with Dumbbells  - 1 x daily - 7 x weekly - 3 sets - 10 reps ASSESSMENT:   CLINICAL IMPRESSION:  Alicia Hunt presents  with improved pain and states she will be traveling to [place] for 2 months and today will be last day.  She states she has a good HEP. PT added 2 additional exercises today to do while she travels.  She has started prednisone which has decreased her arm symptoms of N/T. She still has slight N/T on anterior 2nd and third digit. Nerve glides were emphasized and return demo.  Pt improved FOTO to 66% and achieved goal before DC. Pt still with pain 2/10 but much improved.  Pt is able to lie of side to sleep without issue Pt reviewed HEP and spent remainder of RX with manual over R  UE scar tissue and R deltoid/ and cervical distraction and STW to upper trap and scalenes. Pt with increased AROM of cervical post RX.   Pt will now be DC due to pt request and pleased with current level of function. Alicia Hunt has been and exemplary pt and should do well on her own with HEP as she travels     [place] decreased ROM, decreased strength, impaired sensation, impaired UE functional use, postural dysfunction, obesity, and pain.    ACTIVITY LIMITATIONS cleaning, driving, meal prep, laundry, and yard work.    PERSONAL FACTORS  sleep apnea  are also affecting patient's functional outcome.      REHAB POTENTIAL: Good   CLINICAL DECISION MAKING: Stable/uncomplicated   EVALUATION COMPLEXITY: Low     GOALS: Goals reviewed with patient? Yes   SHORT TERM GOALS: Target date: 10/15/2021   Pt will be independent with initial HEP and posture awareness Baseline: limited knowledge 10-15-21 met Goal status:  MET   2.  Pt pain level at rest will be 2/10 or less Baseline: Pain at rest is 5/10 and with C 5 /6 radicular tingling in R hand 10-08-21 2/10 Goal status: MET   3.  Pt will be able to demonstrate proper posture for household chores and gardening and verbalize 3 strategies for reducing discomfort with lifting and using tools Baseline: no knowledge 10-15-21  Pt is more conscious about posture with household tasks Goal status: Met       LONG TERM GOALS: Target date: 11/05/2021   Pt will be independent with advanced HEP Baseline: limited knowledge, 10-15-21  independent with HEP given Goal status: MET   2.  Pt will be able to drive and scan environment without exacerbating pain in R neck and shoulder Baseline: Pt is 9/10 while turning neck to R able to scan without  pain Goal status: MET   3.  Pt will be able to perform household chores like lifting pots and pan without exacerbating pain in R neck and shld Baseline: 9/10 when lifting and doing any household chores 10-15-21 2/10 with household chores or less Goal status: MET   4.  Pt will utilized good posture when quilting and doing needlework  with modified positions Baseline: cannot do quilting now, 10-15-21  Pt still has some pain with quilting. Advised to get equipment to assist. Limiting time with [facility] Goal status:Partially met   5.  Pt will be able to sleep on R side or turn to right while sleeping without waking and causing pain in R neck and shld Baseline: unable to sleep on R or Left side,10-15-21  able to sleep on sides now Goal status: IMET   6.  FOTO will improve from  50%  to   65%  indicating improved functional mobility.  Baseline: eval 50% 10-15-21  66% Goal status: MET     PLAN: PT FREQUENCY: 2x/week   PT DURATION: 6 weeks   PLANNED INTERVENTIONS: Therapeutic exercises, Therapeutic activity, Neuromuscular re-education, Balance training, Gait training, Patient/Family education, Joint mobilization, Dry Needling, Electrical stimulation, Spinal mobilization, Cryotherapy, Moist heat, Taping, Ionotophoresis 24m/ml Dexamethasone, and Manual therapy   PLAN FOR NEXT SESSION:  DBarry PT, ATrent WoodsCertified Exercise Expert for the Aging Adult  10/15/21 12:25 PM Phone: [phone]: [phone]

## 2021-10-17 ENCOUNTER — Ambulatory Visit: Payer: Medicare Other | Admitting: Physical Therapy

## 2021-10-22 ENCOUNTER — Ambulatory Visit: Payer: Medicare Other | Admitting: Physical Therapy

## 2021-10-23 ENCOUNTER — Ambulatory Visit: Payer: Medicare Other | Admitting: Family Medicine

## 2021-10-24 ENCOUNTER — Encounter: Payer: Medicare Other | Admitting: Physical Therapy

## 2021-12-16 ENCOUNTER — Telehealth: Payer: Self-pay | Admitting: Internal Medicine

## 2021-12-16 ENCOUNTER — Encounter: Payer: Self-pay | Admitting: Internal Medicine

## 2021-12-16 NOTE — Telephone Encounter (Signed)
Patient sent my-chart message asking to increase to 1 mg.

## 2021-12-16 NOTE — Telephone Encounter (Signed)
Patient would like to discuss dosage of Ozempic - Please call

## 2021-12-17 MED ORDER — SEMAGLUTIDE (1 MG/DOSE) 4 MG/3ML ~~LOC~~ SOPN: 1.0000 mg | PEN_INJECTOR | SUBCUTANEOUS | 2 refills | Status: DC

## 2021-12-23 ENCOUNTER — Encounter: Payer: Self-pay | Admitting: Gastroenterology

## 2022-01-07 ENCOUNTER — Other Ambulatory Visit: Payer: Self-pay | Admitting: Neurology

## 2022-01-07 ENCOUNTER — Encounter: Payer: Self-pay | Admitting: Neurology

## 2022-01-07 ENCOUNTER — Ambulatory Visit (INDEPENDENT_AMBULATORY_CARE_PROVIDER_SITE_OTHER): Payer: Medicare Other | Admitting: Neurology

## 2022-01-07 VITALS — BP 135/77 | HR 69 | Ht 64.0 in | Wt 198.5 lb

## 2022-01-07 DIAGNOSIS — Z9989 Dependence on other enabling machines and devices: Secondary | ICD-10-CM | POA: Diagnosis not present

## 2022-01-07 DIAGNOSIS — G4733 Obstructive sleep apnea (adult) (pediatric): Secondary | ICD-10-CM | POA: Diagnosis not present

## 2022-01-07 NOTE — Progress Notes (Signed)
SLEEP MEDICINE CLINIC    Provider:  Melvyn Novas, MD  Primary Care Physician:  Pincus Sanes, MD [address] [place] [address]     Referring Provider: Pincus Sanes, Md [address] [place],  [place] [address]          Chief Complaint according to patient   Patient presents with:     New Patient (Initial Visit)     Here to today for sleep consult. Pt needing a new machine and supplies. Pts current CPAP is a Resmed 9.       HISTORY OF PRESENT ILLNESS:  Alicia Hunt is a 66 y.o. female patient seen here as a referral on 01/07/2022 from Dr Lawerance Bach, MD.  .  Chief concern according to patient : "Patient was seen last in 2017 , at the time diagnosed with OSA and started CPAP in 2017. This summer , while vacationing in [place] ,her CPAP broke down, the repair did cost 300 dollars and she is in need for new machine. Would like to try a new mask, uses a FFM "     Alicia Hunt  has a past medical history of Anxiety, CPAP (continuous positive airway pressure) dependence, Depression, Diverticulitis, Hyperlipidemia, Hypertension, Hypothyroidism, and Sleep apnea.   The patient had the first sleep study in the year 2017.    Sleep relevant medical history: ON CPAP: no longer snoring, no longer dry mouth, no longer nocturia. No more morning headaches. Mother passed at age 64 with major stroke.    Social history:  Patient is working as a Arts development officer. She  lives in a household with husband , no pets.  Tobacco use- never .  ETOH use: none ,  Caffeine intake in form of Coffee( 1-2 cups in AM) Soda( /) Tea ( /) or energy drinks. Regular exercise in form of walking.  Working out - improved sleep.  Hobbies :cooking.     Sleep habits are as follows: The patient's dinner time is between 7 PM. The patient goes to bed at 11 PM and continues to sleep for 7 hours, wakes rarely for bathroom breaks, the first time at 4 AM.   The preferred sleep position is laterally,  with the support of 1 pillows. Dreams are reportedly frequent/vivid.  The patient wakes up spontaneously at 8.30- 8.30   AM is the usual rise time.   She reports feeling refreshed /restored in AM, ever since being on CPAP.  CPAP compliance data:   This is Alicia Hunt has been a highly compliant CPAP user, 97% by days and 93% by time. Machine is an Landscape architect between 4-10 cm water, residual AHI 0.7 /h.       Review of Systems: Out of a complete 14 system review, the patient complains of only the following symptoms, and all other reviewed systems are negative.:     How likely are you to doze in the following situations: 0 = not likely, 1 = slight chance, 2 = moderate chance, 3 = high chance   Sitting and Reading? Watching Television? Sitting inactive in a public place (theater or meeting)? As a passenger in a car for an hour without a break? Lying down in the afternoon when circumstances permit? Sitting and talking to someone? Sitting quietly after lunch without alcohol? In a car, while stopped for a few minutes in traffic?   Total = 13/ 24 points   FSS endorsed at 43/ 63 points.  Social History   Socioeconomic History   Marital status: Married    Spouse name: Environmental health practitioner   Number of children: 2   Years of education: Not on file   Highest education level: Not on file  Occupational History   Occupation: housewife  Tobacco Use   Smoking status: Never   Smokeless tobacco: Never  Vaping Use   Vaping Use: Never used  Substance and Sexual Activity   Alcohol use: Yes    Comment: once a week-social   Drug use: Never   Sexual activity: Not on file  Other Topics Concern   Not on file  Social History Narrative   Lives at home with husband   R handed   Caffeine: 2 C of coffee a day   Social Determinants of Corporate investment banker Strain: Not on file  Food Insecurity: Not on file  Transportation Needs: Not on file  Physical Activity: Not on file  Stress: Not on  file  Social Connections: Not on file    Family History  Problem Relation Age of Onset   Stroke Mother 50       cause of death   Hypertension Mother    Hypertension Father    Breast cancer Neg Hx     Past Medical History:  Diagnosis Date   Anxiety    CPAP (continuous positive airway pressure) dependence    Depression    Diverticulitis    Hyperlipidemia    Hypertension    Hypothyroidism    Sleep apnea    CPAP    Past Surgical History:  Procedure Laterality Date   ABDOMINAL HYSTERECTOMY     BREAST EXCISIONAL BIOPSY Left 20+ years   pt states dr Archie Balboa remove lump   CHOLECYSTECTOMY       Current Outpatient Medications on File Prior to Visit  Medication Sig Dispense Refill   amLODipine-olmesartan (AZOR) 5-40 MG tablet TAKE 1 TABLET BY MOUTH  DAILY 90 tablet 3   atorvastatin (LIPITOR) 10 MG tablet TAKE 1 TABLET BY MOUTH  DAILY 90 tablet 3   escitalopram (LEXAPRO) 10 MG tablet TAKE 1 TABLET BY MOUTH  DAILY 90 tablet 3   levothyroxine (SYNTHROID) 50 MCG tablet TAKE 1 TABLET BY MOUTH  DAILY 90 tablet 3   Multiple Vitamin (MULTIVITAMIN) capsule Take 1 capsule by mouth daily.     Semaglutide, 1 MG/DOSE, 4 MG/3ML SOPN Inject 1 mg as directed once a week. 3 mL 2   No current facility-administered medications on file prior to visit.    No Known Allergies  Physical exam:  Today's Vitals   01/07/22 1348  BP: 135/77  Pulse: 69  Weight: 198 lb 8 oz (90 kg)  Height: 5\' 4"  (1.626 m)   Body mass index is 34.07 kg/m.   Wt Readings from Last 3 Encounters:  01/07/22 198 lb 8 oz (90 kg)  09/16/21 208 lb 12.8 oz (94.7 kg)  08/26/21 211 lb 8 oz (95.9 kg)     Ht Readings from Last 3 Encounters:  01/07/22 5\' 4"  (1.626 m)  09/16/21 5\' 3"  (1.6 m)  08/26/21 5\' 3"  (1.6 m)      General: The patient is awake, alert and appears not in acute distress. The patient is well groomed. Head: Normocephalic, atraumatic. Neck is supple. Small goiter.  Mallampati 3,  neck circumference:16  inches . Nasal airflow patent.  Retrognathia is not seen. Bruxism marks, wears mouth guard.  Dental status: biological  Cardiovascular:  Regular rate and cardiac rhythm  by pulse,  without distended neck veins. Respiratory: Lungs are clear to auscultation.  Skin:  Without evidence of ankle edema, or rash. Trunk: The patient's posture is erect.   Neurologic exam : The patient is awake and alert, oriented to place and time.   Memory subjective described as intact.  Attention span & concentration ability appears normal.  Speech is fluent,  without dysarthria, dysphonia or aphasia.  Mood and affect are appropriate.   Cranial nerves: no loss of smell or taste reported  Pupils are equal and briskly reactive to light. Funduscopic exam deferred. .  Extraocular movements in vertical and horizontal planes were intact and without nystagmus. No Diplopia. Visual fields by finger perimetry are intact. Hearing was intact.    Facial sensation intact to fine touch.  Facial motor strength is symmetric and tongue and uvula move midline.  Neck ROM : rotation, tilt and flexion extension were normal for age and shoulder shrug was symmetrical.    Motor exam:  Symmetric bulk, tone and ROM.   Normal tone without cog-wheeling, symmetric grip strength.    Sensory:  Fine touch was normal.  Proprioception tested in the upper extremities was normal.   Coordination: Rapid alternating movements in the fingers/hands were of normal speed.  The Finger-to-nose maneuver was intact without evidence of ataxia, dysmetria or tremor.   Gait and station: Patient could rise unassisted from a seated position, walked without assistive device.  Stance is of normal width/ base.  Toe and heel walk were deferred.  Deep tendon reflexes: in the  upper and lower extremities are symmetric and intact.  Babinski response was deferred .        After spending a total time of  30  minutes face to face and additional time for physical  and neurologic examination, review of laboratory studies,  personal review of imaging studies, reports and results of other testing and review of referral information / records as far as provided in visit, I have established the following assessments:  1) The patient was successfully treated for OSA and the CPAP therapy helped to reduce snoring, EDS, and morning headaches and nocturia.  2) No insomnia reported since she is working out.  3) needs new CPAP and mask fitting.    My Plan is to proceed with:  1)HST for baseline study- or SPLIT at AHI 10 /h to get new mask fitting incorporated.    I would like to thank Binnie Rail, MD and Binnie Rail, [place],   [address] for allowing me to meet with and to take care of this pleasant patient.    Marlaine Hind will  follow up either personally or through our NP within 2-3 months.   CC: I will share my notes with PCP.  Electronically signed by: Larey Seat, MD 01/07/2022 2:11 PM  [facility] certified by The [employer] of Sleep Medicine and Diplomate of the [employer] of Sleep Medicine. Board certified In Neurology through the [employer], Fellow of the [employer] of Neurology. Medical Director of [employer].

## 2022-01-07 NOTE — Patient Instructions (Signed)
Screening for Sleep Apnea  Sleep apnea is a condition in which breathing pauses or becomes shallow during sleep. Sleep apnea screening is a test to determine if you are at risk for sleep apnea. The test includes a series of questions. It will only takes a few minutes. Your health care provider may ask you to have this test in preparation for surgery or as part of a physical exam. What are the symptoms of sleep apnea? Common symptoms of sleep apnea include: Snoring. Waking up often at night. Daytime sleepiness. Pauses in breathing. Choking or gasping during sleep. Irritability. Forgetfulness. Trouble thinking clearly. Depression. Personality changes. Most people with sleep apnea do not know that they have it. What are the advantages of sleep apnea screening? Getting screened for sleep apnea can help: Ensure your safety. It is important for your health care providers to know whether or not you have sleep apnea, especially if you are having surgery or have other long-term (chronic) health conditions. Improve your health and allow you to get a better night's rest. Restful sleep can help you: Have more energy. Lose weight. Improve high blood pressure. Improve diabetes management. Prevent stroke. Prevent car accidents. What happens during the screening? Screening usually includes being asked a list of questions about your sleep quality. Some questions you may be asked include: Do you snore? Is your sleep restless? Do you have daytime sleepiness? Has a partner or spouse told you that you stop breathing during sleep? Have you had trouble concentrating or memory loss? What is your age? What is your neck circumference? To measure your neck, keep your back straight and gently wrap the tape measure around your neck. Put the tape measure at the middle of your neck, between your chin and collarbone. What is your sex assigned at birth? Do you have or are you being treated for high blood  pressure? If your screening test is positive, you are at risk for the condition. Further testing may be needed to confirm a diagnosis of sleep apnea. Where to find more information You can find screening tools online or at your health care clinic. For more information about sleep apnea screening and healthy sleep, visit these websites: Centers for Disease Control and Prevention: www.cdc.gov American Sleep Apnea Association: www.sleepapnea.org Contact a health care provider if: You think that you may have sleep apnea. Summary Sleep apnea screening can help determine if you are at risk for sleep apnea. It is important for your health care providers to know whether or not you have sleep apnea, especially if you are having surgery or have other chronic health conditions. You may be asked to take a screening test for sleep apnea in preparation for surgery or as part of a physical exam. This information is not intended to replace advice given to you by your health care provider. Make sure you discuss any questions you have with your health care provider. Document Revised: 04/13/2020 Document Reviewed: 04/13/2020 Elsevier Patient Education  2023 Elsevier Inc.  

## 2022-01-08 ENCOUNTER — Telehealth: Payer: Self-pay | Admitting: Neurology

## 2022-01-08 LAB — HM DIABETES EYE EXAM

## 2022-01-08 NOTE — Telephone Encounter (Signed)
I see in the notes you put HST for baseline study or Split, but only a HST was order. Do you prefer the patient to have a HST or Split?

## 2022-01-08 NOTE — Telephone Encounter (Signed)
The patient at checkout specifically mentioned her completing the home sleep test. If we can get HST that would be fine. I can't get her set up locally with any DME company until I have that information.

## 2022-01-09 ENCOUNTER — Ambulatory Visit (INDEPENDENT_AMBULATORY_CARE_PROVIDER_SITE_OTHER): Payer: Medicare Other | Admitting: Neurology

## 2022-01-09 DIAGNOSIS — G4733 Obstructive sleep apnea (adult) (pediatric): Secondary | ICD-10-CM | POA: Diagnosis not present

## 2022-01-09 DIAGNOSIS — Z9989 Dependence on other enabling machines and devices: Secondary | ICD-10-CM

## 2022-01-09 NOTE — Telephone Encounter (Signed)
Noted, thank you. Patient is scheduled for her HST.  [employer] medicare no auth req

## 2022-01-14 ENCOUNTER — Encounter: Payer: Self-pay | Admitting: Neurology

## 2022-01-14 NOTE — Procedures (Signed)
    [facility]   HOME SLEEP TEST REPORT ( by Watch PAT)   STUDY DATE:  01-14-2022 DOB:  03-26-1956    ORDERING CLINICIAN: Melvyn Novas, MD  REFERRING CLINICIAN: Cheryll Cockayne, MD   CLINICAL INFORMATION/HISTORY: This OSA patient has been highly complaint with 7-8 years of CPAP use, residual AHI of 0.7/h under a setting from 4-10 cm water CPAP, now needing supplies and a new machine.   Epworth sleepiness score: 13/24.   BMI: 33.9 kg/m   Neck Circumference: 16"   FINDINGS:   Sleep Summary:   Total Recording Time (hours, min):  7 h and 32 m      Total Sleep Time (hours, min):    6 h and 52 m             Percent REM (%):  16.6%                                      Respiratory Indices:   Calculated pAHI (per hour):   42.7/h                          REM pAHI:   37.3/h                                          NREM pAHI:  43.8/h                            Supine AHI: 37.9/h, Right lateral AHI was 31.9/h , Left lateral AHI was 59.8/h.    snoring reached a mean Volume of 44dB                                                Oxygen Saturation Statistics:     O2 Saturation Range (%):  80 to 98 %                                      O2 Saturation (minutes) <89%:  0.6 minutes         Pulse Rate Statistics:   Pulse Mean (bpm):   66 bpm              Pulse Range: 51 through 139 bpm                IMPRESSION:  This HST confirms the presence of presence of still severe OSA and  tachycardia , but no significant hypoxia. The patient should continue CPAP therapy.   RECOMMENDATION: I ordered an auto CPAP device by ResMed, settings between 5-12 cm water pressure, 1 cm EPR and interface of patient's choice.     INTERPRETING PHYSICIAN:   Melvyn Novas, MD   Medical Director of [facility].

## 2022-01-14 NOTE — Addendum Note (Signed)
Addended by: Melvyn Novas on: 01/14/2022 03:43 PM   Modules accepted: Orders

## 2022-01-17 DIAGNOSIS — H524 Presbyopia: Secondary | ICD-10-CM | POA: Diagnosis not present

## 2022-01-17 DIAGNOSIS — H2513 Age-related nuclear cataract, bilateral: Secondary | ICD-10-CM | POA: Diagnosis not present

## 2022-01-17 DIAGNOSIS — E119 Type 2 diabetes mellitus without complications: Secondary | ICD-10-CM | POA: Diagnosis not present

## 2022-01-22 ENCOUNTER — Encounter: Payer: Self-pay | Admitting: Internal Medicine

## 2022-01-22 NOTE — Progress Notes (Signed)
Outside notes received. Information abstracted. Notes sent to scan.  

## 2022-01-23 ENCOUNTER — Other Ambulatory Visit: Payer: Self-pay | Admitting: Internal Medicine

## 2022-01-27 ENCOUNTER — Ambulatory Visit (AMBULATORY_SURGERY_CENTER): Payer: Self-pay | Admitting: *Deleted

## 2022-01-27 VITALS — Ht 63.0 in | Wt 195.0 lb

## 2022-01-27 DIAGNOSIS — Z1211 Encounter for screening for malignant neoplasm of colon: Secondary | ICD-10-CM

## 2022-01-27 MED ORDER — NA SULFATE-K SULFATE-MG SULF 17.5-3.13-1.6 GM/177ML PO SOLN: 1.0000 | ORAL | 0 refills | Status: DC

## 2022-01-27 NOTE — Progress Notes (Signed)
Patient is here in-person for PV. Patient denies any allergies to eggs or soy. Patient denies any problems with anesthesia/sedation. Patient is not on any oxygen at home. Patient is not taking any diet/weight loss medications or blood thinners. Went over procedure prep instructions with the patient. Patient is aware of our care-partner policy. Patient request mail order pharmacy. Pt uses Miralax as needed for constipation-she will use Miralax QD with prep. Pt aware not to take Ozempic day of procedure and hold 3 days before colonoscopy.

## 2022-01-31 ENCOUNTER — Encounter: Payer: Self-pay | Admitting: Gastroenterology

## 2022-02-07 ENCOUNTER — Ambulatory Visit: Payer: Self-pay

## 2022-02-07 DIAGNOSIS — S61214A Laceration without foreign body of right ring finger without damage to nail, initial encounter: Secondary | ICD-10-CM | POA: Diagnosis not present

## 2022-02-07 DIAGNOSIS — W268XXA Contact with other sharp object(s), not elsewhere classified, initial encounter: Secondary | ICD-10-CM | POA: Diagnosis not present

## 2022-02-16 ENCOUNTER — Other Ambulatory Visit: Payer: Self-pay | Admitting: Internal Medicine

## 2022-02-17 ENCOUNTER — Ambulatory Visit (AMBULATORY_SURGERY_CENTER): Payer: Medicare Other | Admitting: Gastroenterology

## 2022-02-17 ENCOUNTER — Encounter: Payer: Self-pay | Admitting: Gastroenterology

## 2022-02-17 VITALS — BP 134/76 | HR 64 | Temp 96.0°F | Resp 12 | Ht 63.0 in | Wt 195.0 lb

## 2022-02-17 DIAGNOSIS — Z1211 Encounter for screening for malignant neoplasm of colon: Secondary | ICD-10-CM | POA: Diagnosis not present

## 2022-02-17 MED ORDER — SODIUM CHLORIDE 0.9 % IV SOLN: 500.0000 mL | Freq: Once | INTRAVENOUS | Status: DC

## 2022-02-17 NOTE — Progress Notes (Signed)
Pt resting comfortably. VSS. Airway intact. SBAR complete to RN. All questions answered.   

## 2022-02-17 NOTE — Progress Notes (Signed)
VS completed by DT.  Pt's states no medical or surgical changes since previsit or office visit.  

## 2022-02-17 NOTE — Progress Notes (Signed)
[facility] History and Physical   Primary Care Physician:  Binnie Rail, MD   Reason for Procedure:  Colorectal cancer screening  Plan:    Screening colonoscopy with possible interventions as needed     HPI: Alicia Hunt is a very pleasant 66 y.o. female here for screening colonoscopy. Denies any nausea, vomiting, abdominal pain, melena or bright red blood per rectum  The risks and benefits as well as alternatives of endoscopic procedure(s) have been discussed and reviewed. All questions answered. The patient agrees to proceed.    Past Medical History:  Diagnosis Date   Anxiety    CPAP (continuous positive airway pressure) dependence    Depression    Diverticulitis    Hyperlipidemia    Hypertension    Hypothyroidism    Sleep apnea    CPAP    Past Surgical History:  Procedure Laterality Date   ABDOMINAL HYSTERECTOMY     BREAST EXCISIONAL BIOPSY Left 20+ years   pt states dr didnt remove lump   CHOLECYSTECTOMY     COLONOSCOPY     12 years ago-Diverticulitis bleed in [place]    Prior to Admission medications   Medication Sig Start Date End Date Taking? Authorizing Provider  atorvastatin (LIPITOR) 10 MG tablet TAKE 1 TABLET BY MOUTH  DAILY 10/01/21  Yes Burns, Claudina Lick, MD  Cholecalciferol (VITAMIN D3 PO) Take by mouth.   Yes [provider]  Cyanocobalamin (VITAMIN B-12 PO) Take by mouth.   Yes [provider]  escitalopram (LEXAPRO) 10 MG tablet TAKE 1 TABLET BY MOUTH  DAILY 01/24/22  Yes Burns, Claudina Lick, MD  levothyroxine (SYNTHROID) 50 MCG tablet TAKE 1 TABLET BY MOUTH  DAILY 02/28/21  Yes Burns, Claudina Lick, MD  Multiple Vitamin (MULTIVITAMIN) capsule Take 1 capsule by mouth daily. 11/22/18  Yes Burns, Claudina Lick, MD  amLODipine-olmesartan (AZOR) 5-40 MG tablet TAKE 1 TABLET BY MOUTH  DAILY 02/17/22   Binnie Rail, MD  Semaglutide, 1 MG/DOSE, 4 MG/3ML SOPN Inject 1 mg as directed once a week. 12/17/21   Binnie Rail, MD    Current  Outpatient Medications  Medication Sig Dispense Refill   atorvastatin (LIPITOR) 10 MG tablet TAKE 1 TABLET BY MOUTH  DAILY 90 tablet 3   Cholecalciferol (VITAMIN D3 PO) Take by mouth.     Cyanocobalamin (VITAMIN B-12 PO) Take by mouth.     escitalopram (LEXAPRO) 10 MG tablet TAKE 1 TABLET BY MOUTH  DAILY 90 tablet 3   levothyroxine (SYNTHROID) 50 MCG tablet TAKE 1 TABLET BY MOUTH  DAILY 90 tablet 3   Multiple Vitamin (MULTIVITAMIN) capsule Take 1 capsule by mouth daily.     amLODipine-olmesartan (AZOR) 5-40 MG tablet TAKE 1 TABLET BY MOUTH  DAILY 100 tablet 2   Semaglutide, 1 MG/DOSE, 4 MG/3ML SOPN Inject 1 mg as directed once a week. 3 mL 2   Current Facility-Administered Medications  Medication Dose Route Frequency Provider Last Rate Last Admin   0.9 %  sodium chloride infusion  500 mL Intravenous Once Mauri Pole, MD        Allergies as of 02/17/2022   (No Known Allergies)    Family History  Problem Relation Age of Onset   Stroke Mother 83       cause of death   Hypertension Mother    Hypertension Father    Breast cancer Neg Hx    Colon cancer Neg Hx    Colon polyps Neg Hx  Esophageal cancer Neg Hx    Stomach cancer Neg Hx    Rectal cancer Neg Hx     Social History   Socioeconomic History   Marital status: Married    Spouse name: Medical sales representative   Number of children: 2   Years of education: Not on file   Highest education level: Not on file  Occupational History   Occupation: housewife  Tobacco Use   Smoking status: Never   Smokeless tobacco: Never  Vaping Use   Vaping Use: Never used  Substance and Sexual Activity   Alcohol use: Yes    Alcohol/week: 1.0 standard drink of alcohol    Types: 1 Glasses of wine per week    Comment: wine occ-social   Drug use: Never   Sexual activity: Not on file  Other Topics Concern   Not on file  Social History Narrative   Lives at home with husband   R handed   Caffeine: 2 C of coffee a day   Social Determinants of  Radio broadcast assistant Strain: Not on file  Food Insecurity: Not on file  Transportation Needs: Not on file  Physical Activity: Not on file  Stress: Not on file  Social Connections: Not on file  Intimate Partner Violence: Not on file    Review of Systems:  All other review of systems negative except as mentioned in the HPI.  Physical Exam: Vital signs in last 24 hours: Blood Pressure 112/64   Pulse 66   Temperature (Abnormal) 96 F (35.6 C) (Temporal)   Height 5\' 3"  (1.6 m)   Weight 195 lb (88.5 kg)   Oxygen Saturation 97%   Body Mass Index 34.54 kg/m  General:   Alert, NAD Lungs:  Clear .   Heart:  Regular rate and rhythm Abdomen:  Soft, nontender and nondistended. Neuro/Psych:  Alert and cooperative. Normal mood and affect. A and O x 3  Reviewed labs, radiology imaging, old records and pertinent past GI work up  Patient is appropriate for planned procedure(s) and anesthesia in an ambulatory setting   K. Denzil Magnuson , MD [phone]

## 2022-02-17 NOTE — Patient Instructions (Signed)
-   Patient has a contact number available for emergencies. The signs and symptoms of potential delayed complications were discussed with the patient. Return to normal activities tomorrow. Written discharge instructions were provided to the patient. - Resume previous diet. - Continue present medications. - No repeat colonoscopy due to current age (31 years or older) and the absence of advanced adenomas. -Handout on diverticulosis and hemorrhoids provided   YOU HAD AN ENDOSCOPIC PROCEDURE TODAY AT [facility]:   Refer to the procedure report that was given to you for any specific questions about what was found during the examination.  If the procedure report does not answer your questions, please call your gastroenterologist to clarify.  If you requested that your care partner not be given the details of your procedure findings, then the procedure report has been included in a sealed envelope for you to review at your convenience later.  YOU SHOULD EXPECT: Some feelings of bloating in the abdomen. Passage of more gas than usual.  Walking can help get rid of the air that was put into your GI tract during the procedure and reduce the bloating. If you had a lower endoscopy (such as a colonoscopy or flexible sigmoidoscopy) you may notice spotting of blood in your stool or on the toilet paper. If you underwent a bowel prep for your procedure, you may not have a normal bowel movement for a few days.  Please Note:  You might notice some irritation and congestion in your nose or some drainage.  This is from the oxygen used during your procedure.  There is no need for concern and it should clear up in a day or so.  SYMPTOMS TO REPORT IMMEDIATELY:  Following lower endoscopy (colonoscopy or flexible sigmoidoscopy):  Excessive amounts of blood in the stool  Significant tenderness or worsening of abdominal pains  Swelling of the abdomen that is new, acute  Fever of 100F or higher   For urgent  or emergent issues, a gastroenterologist can be reached at any hour by calling [phone]. Do not use MyChart messaging for urgent concerns.    DIET:  We do recommend a small meal at first, but then you may proceed to your regular diet.  Drink plenty of fluids but you should avoid alcoholic beverages for 24 hours.  ACTIVITY:  You should plan to take it easy for the rest of today and you should NOT DRIVE or use heavy machinery until tomorrow (because of the sedation medicines used during the test).    FOLLOW UP: Our staff will call the number listed on your records the next business day following your procedure.  We will call around 7:15- 8:00 am to check on you and address any questions or concerns that you may have regarding the information given to you following your procedure. If we do not reach you, we will leave a message.     If any biopsies were taken you will be contacted by phone or by letter within the next 1-3 weeks.  Please call us at [phone] if you have not heard about the biopsies in 3 weeks.    SIGNATURES/CONFIDENTIALITY: You and/or your care partner have signed paperwork which will be entered into your electronic medical record.  These signatures attest to the fact that that the information above on your After Visit Summary has been reviewed and is understood.  Full responsibility of the confidentiality of this discharge information lies with you and/or your care-partner.

## 2022-02-17 NOTE — Op Note (Signed)
[place] Patient Name: Alicia Hunt Procedure Date: 02/17/2022 11:12 AM MRN: [id number] Endoscopist: Mauri Pole , MD Age: 66 Referring MD:  Date of Birth: 03-10-1956 Gender: Female Account #: [id number] Procedure:                Colonoscopy Indications:              Screening for colorectal malignant neoplasm Medicines:                Monitored Anesthesia Care Procedure:                Pre-Anesthesia Assessment:                           - Prior to the procedure, a History and Physical                            was performed, and patient medications and                            allergies were reviewed. The patient's tolerance of                            previous anesthesia was also reviewed. The risks                            and benefits of the procedure and the sedation                            options and risks were discussed with the patient.                            All questions were answered, and informed consent                            was obtained. Prior Anticoagulants: The patient has                            taken no previous anticoagulant or antiplatelet                            agents. ASA Grade Assessment: II - A patient with                            mild systemic disease. After reviewing the risks                            and benefits, the patient was deemed in                            satisfactory condition to undergo the procedure.                           After obtaining informed consent, the colonoscope  was passed under direct vision. Throughout the                            procedure, the patient's blood pressure, pulse, and                            oxygen saturations were monitored continuously. The                            PCF-HQ190L Colonoscope was introduced through the                            anus and advanced to the the cecum, identified by                             appendiceal orifice and ileocecal valve. The                            colonoscopy was performed without difficulty. The                            patient tolerated the procedure well. The quality                            of the bowel preparation was excellent. The                            ileocecal valve, appendiceal orifice, and rectum                            were photographed. Scope In: 11:27:05 AM Scope Out: 11:41:45 AM Scope Withdrawal Time: 0 hours 7 minutes 25 seconds  Total Procedure Duration: 0 hours 14 minutes 40 seconds  Findings:                 The perianal and digital rectal examinations were                            normal.                           Scattered small and large-mouthed diverticula were                            found in the sigmoid colon, descending colon,                            transverse colon and ascending colon.                           There was a small lipoma, 11 mm in diameter, in the                            ascending colon.  Non-bleeding external and internal hemorrhoids were                            found during retroflexion. The hemorrhoids were                            medium-sized.                           The exam was otherwise without abnormality. Complications:            No immediate complications. Estimated Blood Loss:     Estimated blood loss was minimal. Impression:               - Moderate diverticulosis in the sigmoid colon, in                            the descending colon, in the transverse colon and                            in the ascending colon.                           - Small lipoma in the ascending colon.                           - Non-bleeding external and internal hemorrhoids.                           - The examination was otherwise normal.                           - No specimens collected. Recommendation:           - Patient has a contact number available for                             emergencies. The signs and symptoms of potential                            delayed complications were discussed with the                            patient. Return to normal activities tomorrow.                            Written discharge instructions were provided to the                            patient.                           - Resume previous diet.                           - Continue present medications.                           -  No repeat colonoscopy due to current age (53                            years or older) and the absence of advanced                            adenomas. Mauri Pole, MD 02/17/2022 11:45:55 AM This report has been signed electronically.

## 2022-02-18 ENCOUNTER — Telehealth: Payer: Self-pay

## 2022-02-18 ENCOUNTER — Encounter: Payer: Self-pay | Admitting: Gastroenterology

## 2022-02-18 NOTE — Telephone Encounter (Signed)
  Follow up Call-     02/17/2022   10:40 AM  Call back number  Post procedure Call Back phone  # [phone]  Permission to leave phone message Yes    Post op call attempted, no answer, no WM.

## 2022-02-19 DIAGNOSIS — Z4802 Encounter for removal of sutures: Secondary | ICD-10-CM | POA: Diagnosis not present

## 2022-02-25 ENCOUNTER — Encounter: Payer: Self-pay | Admitting: Internal Medicine

## 2022-02-25 NOTE — Patient Instructions (Addendum)
   [facility] [phone]) dermatology specialists [phone]),  [facility] general dermatology on [place]. [address] [phone]),  [facility] [phone]),  [facility] [phone])    Prevnar 20 and flu vaccine given today.    If you want a tetanus vaccine, shingles vaccine or covid booster - get those at the pharmacy.     Blood work was ordered.     Medications changes include :   ozempic 2 mg weekly   Your prescription(s) have been sent to your pharmacy.     Return in about 6 months (around 08/28/2022) for Physical Exam.

## 2022-02-25 NOTE — Progress Notes (Unsigned)
Subjective:    Patient ID: Alicia Hunt, female    DOB: April 20, 1956, 66 y.o.   MRN: [id number]     HPI Alicia Hunt is here for follow up of her chronic medical problems, including htn, hypothyroid, hld, prediabetes, depression, vit d and B12 def  She is walking regularly.  She is doing some weights.   Doing well with ozempic - has lost some weight.    Medications and allergies reviewed with patient and updated if appropriate.  Current Outpatient Medications on File Prior to Visit  Medication Sig Dispense Refill   amLODipine-olmesartan (AZOR) 5-40 MG tablet TAKE 1 TABLET BY MOUTH  DAILY 100 tablet 2   atorvastatin (LIPITOR) 10 MG tablet TAKE 1 TABLET BY MOUTH  DAILY 90 tablet 3   Cholecalciferol (VITAMIN D3 PO) Take by mouth.     Cyanocobalamin (VITAMIN B-12 PO) Take by mouth.     escitalopram (LEXAPRO) 10 MG tablet TAKE 1 TABLET BY MOUTH  DAILY 90 tablet 3   levothyroxine (SYNTHROID) 50 MCG tablet TAKE 1 TABLET BY MOUTH  DAILY 90 tablet 3   Multiple Vitamin (MULTIVITAMIN) capsule Take 1 capsule by mouth daily.     Semaglutide, 1 MG/DOSE, 4 MG/3ML SOPN Inject 1 mg as directed once a week. 3 mL 2   No current facility-administered medications on file prior to visit.     Review of Systems  Constitutional:  Negative for chills and fever.  Respiratory:  Negative for cough, shortness of breath and wheezing.   Cardiovascular:  Negative for chest pain, palpitations and leg swelling.  Neurological:  Negative for light-headedness and headaches.       Objective:   Vitals:   02/26/22 1033  BP: 130/76  Pulse: 78  Temp: 98.1 F (36.7 C)  SpO2: 99%   BP Readings from Last 3 Encounters:  02/26/22 130/76  02/17/22 134/76  01/07/22 135/77   Wt Readings from Last 3 Encounters:  02/26/22 193 lb (87.5 kg)  02/17/22 195 lb (88.5 kg)  01/27/22 195 lb (88.5 kg)   Body mass index is 34.19 kg/m.    Physical Exam Constitutional:      General: She is not in acute  distress.    Appearance: Normal appearance.  HENT:     Head: Normocephalic and atraumatic.  Eyes:     Conjunctiva/sclera: Conjunctivae normal.  Cardiovascular:     Rate and Rhythm: Normal rate and regular rhythm.     Heart sounds: Normal heart sounds. No murmur heard. Pulmonary:     Effort: Pulmonary effort is normal. No respiratory distress.     Breath sounds: Normal breath sounds. No wheezing.  Musculoskeletal:     Cervical back: Neck supple.     Right lower leg: No edema.     Left lower leg: No edema.  Lymphadenopathy:     Cervical: No cervical adenopathy.  Skin:    General: Skin is warm and dry.     Findings: No rash.     Comments: Tip of nail whites enlarging on several fingernails.  Large keloid on abdomen  Neurological:     Mental Status: She is alert. Mental status is at baseline.  Psychiatric:        Mood and Affect: Mood normal.        Behavior: Behavior normal.        Lab Results  Component Value Date   WBC 5.9 05/06/2021   HGB 14.1 05/06/2021   HCT 41.9 05/06/2021   PLT  212.0 05/06/2021   GLUCOSE 104 (H) 08/26/2021   CHOL 132 08/26/2021   TRIG 140.0 08/26/2021   HDL 45.10 08/26/2021   LDLDIRECT 79.0 11/12/2020   LDLCALC 59 08/26/2021   ALT 30 08/26/2021   AST 25 08/26/2021   NA 141 08/26/2021   K 4.3 08/26/2021   CL 104 08/26/2021   CREATININE 0.78 08/26/2021   BUN 11 08/26/2021   CO2 29 08/26/2021   TSH 4.14 08/26/2021   HGBA1C 6.3 08/26/2021     Assessment & Plan:    Will see dermatology for fingernails and keloid   See Problem List for Assessment and Plan of chronic medical problems.

## 2022-02-26 ENCOUNTER — Ambulatory Visit (INDEPENDENT_AMBULATORY_CARE_PROVIDER_SITE_OTHER): Payer: Medicare Other | Admitting: Internal Medicine

## 2022-02-26 VITALS — BP 130/76 | HR 78 | Temp 98.1°F | Ht 63.0 in | Wt 193.0 lb

## 2022-02-26 DIAGNOSIS — Z23 Encounter for immunization: Secondary | ICD-10-CM | POA: Diagnosis not present

## 2022-02-26 DIAGNOSIS — R7303 Prediabetes: Secondary | ICD-10-CM | POA: Diagnosis not present

## 2022-02-26 DIAGNOSIS — R7989 Other specified abnormal findings of blood chemistry: Secondary | ICD-10-CM | POA: Diagnosis not present

## 2022-02-26 DIAGNOSIS — E559 Vitamin D deficiency, unspecified: Secondary | ICD-10-CM | POA: Diagnosis not present

## 2022-02-26 DIAGNOSIS — I10 Essential (primary) hypertension: Secondary | ICD-10-CM

## 2022-02-26 DIAGNOSIS — E7849 Other hyperlipidemia: Secondary | ICD-10-CM

## 2022-02-26 DIAGNOSIS — E039 Hypothyroidism, unspecified: Secondary | ICD-10-CM | POA: Diagnosis not present

## 2022-02-26 DIAGNOSIS — F3289 Other specified depressive episodes: Secondary | ICD-10-CM

## 2022-02-26 LAB — CBC WITH DIFFERENTIAL/PLATELET
Basophils Absolute: 0 10*3/uL (ref 0.0–0.1)
Basophils Relative: 0.7 % (ref 0.0–3.0)
Eosinophils Absolute: 0.2 10*3/uL (ref 0.0–0.7)
Eosinophils Relative: 2.8 % (ref 0.0–5.0)
HCT: 42.2 % (ref 36.0–46.0)
Hemoglobin: 14.1 g/dL (ref 12.0–15.0)
Lymphocytes Relative: 32.2 % (ref 12.0–46.0)
Lymphs Abs: 1.9 10*3/uL (ref 0.7–4.0)
MCHC: 33.4 g/dL (ref 30.0–36.0)
MCV: 85.4 fl (ref 78.0–100.0)
Monocytes Absolute: 0.5 10*3/uL (ref 0.1–1.0)
Monocytes Relative: 7.8 % (ref 3.0–12.0)
Neutro Abs: 3.3 10*3/uL (ref 1.4–7.7)
Neutrophils Relative %: 56.5 % (ref 43.0–77.0)
Platelets: 224 10*3/uL (ref 150.0–400.0)
RBC: 4.95 Mil/uL (ref 3.87–5.11)
RDW: 13.9 % (ref 11.5–15.5)
WBC: 5.9 10*3/uL (ref 4.0–10.5)

## 2022-02-26 LAB — LIPID PANEL
Cholesterol: 137 mg/dL (ref 0–200)
HDL: 47.1 mg/dL (ref >39.00)
LDL Cholesterol: 64 mg/dL (ref 0–99)
NonHDL: 90.04
Total CHOL/HDL Ratio: 3
Triglycerides: 132 mg/dL (ref 0.0–149.0)
VLDL: 26.4 mg/dL (ref 0.0–40.0)

## 2022-02-26 LAB — COMPREHENSIVE METABOLIC PANEL
ALT: 24 U/L (ref 0–35)
AST: 24 U/L (ref 0–37)
Albumin: 3.9 g/dL (ref 3.5–5.2)
Alkaline Phosphatase: 84 U/L (ref 39–117)
BUN: 12 mg/dL (ref 6–23)
CO2: 29 mEq/L (ref 19–32)
Calcium: 9.2 mg/dL (ref 8.4–10.5)
Chloride: 103 mEq/L (ref 96–112)
Creatinine, Ser: 0.84 mg/dL (ref 0.40–1.20)
GFR: 71.45 mL/min (ref >60.00)
Glucose, Bld: 91 mg/dL (ref 70–99)
Potassium: 3.8 mEq/L (ref 3.5–5.1)
Sodium: 139 mEq/L (ref 135–145)
Total Bilirubin: 1 mg/dL (ref 0.2–1.2)
Total Protein: 7.2 g/dL (ref 6.0–8.3)

## 2022-02-26 LAB — VITAMIN D 25 HYDROXY (VIT D DEFICIENCY, FRACTURES): VITD: 43.39 ng/mL (ref 30.00–100.00)

## 2022-02-26 LAB — HEMOGLOBIN A1C: Hgb A1c MFr Bld: 5.7 % (ref 4.6–6.5)

## 2022-02-26 LAB — TSH: TSH: 3.78 u[IU]/mL (ref 0.35–5.50)

## 2022-02-26 LAB — VITAMIN B12: Vitamin B-12: 632 pg/mL (ref 211–911)

## 2022-02-26 MED ORDER — SEMAGLUTIDE (2 MG/DOSE) 8 MG/3ML ~~LOC~~ SOPN: 2.0000 mg | PEN_INJECTOR | SUBCUTANEOUS | 3 refills | Status: DC

## 2022-02-26 MED ORDER — CICLOPIROX 8 % EX SOLN: Freq: Every day | CUTANEOUS | 2 refills | Status: DC

## 2022-02-26 NOTE — Assessment & Plan Note (Addendum)
Chronic  Clinically euthyroid Check tsh and will titrate med dose if needed Currently taking levothyroxine 50 mcg daily 

## 2022-02-26 NOTE — Assessment & Plan Note (Signed)
Chronic Regular exercise and healthy diet encouraged Check lipid panel  Continue atorvastatin 10 mg daily 

## 2022-02-26 NOTE — Assessment & Plan Note (Signed)
Chronic Taking vitamin B12 daily Check B12 level 

## 2022-02-26 NOTE — Assessment & Plan Note (Signed)
Chronic Check a1c Low sugar / carb diet Stressed regular exercise Continue Ozempic 1 mg weekly

## 2022-02-26 NOTE — Assessment & Plan Note (Signed)
Chronic Controlled, Stable Continue Lexapro 10 mg daily 

## 2022-02-26 NOTE — Addendum Note (Signed)
Addended by: Marcina Millard on: 02/26/2022 12:27 PM   Modules accepted: Orders

## 2022-02-26 NOTE — Assessment & Plan Note (Signed)
Chronic Blood pressure well controlled CMP Continue amlodipine-olmesartan 5-40 mg daily

## 2022-02-26 NOTE — Assessment & Plan Note (Signed)
Chronic Taking vitamin D daily Check vitamin D level  

## 2022-02-27 DIAGNOSIS — R0683 Snoring: Secondary | ICD-10-CM | POA: Diagnosis not present

## 2022-02-27 DIAGNOSIS — I10 Essential (primary) hypertension: Secondary | ICD-10-CM | POA: Diagnosis not present

## 2022-02-27 DIAGNOSIS — F32A Depression, unspecified: Secondary | ICD-10-CM | POA: Diagnosis not present

## 2022-02-27 DIAGNOSIS — G4733 Obstructive sleep apnea (adult) (pediatric): Secondary | ICD-10-CM | POA: Diagnosis not present

## 2022-03-04 ENCOUNTER — Other Ambulatory Visit: Payer: Self-pay | Admitting: Internal Medicine

## 2022-03-04 ENCOUNTER — Telehealth: Payer: Self-pay

## 2022-03-04 MED ORDER — CICLOPIROX 8 % EX SOLN: Freq: Every day | CUTANEOUS | 0 refills | Status: DC

## 2022-03-04 NOTE — Telephone Encounter (Signed)
Prescription sent to [facility] for 90 days

## 2022-03-04 NOTE — Telephone Encounter (Signed)
Patient is calling in asking if Dr.Burns meant to call in a 90 day supply of ciclopirox (PENLAC) 8 % solution.

## 2022-03-12 ENCOUNTER — Telehealth: Payer: Self-pay | Admitting: Internal Medicine

## 2022-03-12 NOTE — Telephone Encounter (Signed)
Pt called to report pharmacy is out of stock of the Ozempic 1 mg.  Please advise where pt can pick up the medication.  Call pt [phone]

## 2022-03-13 ENCOUNTER — Other Ambulatory Visit: Payer: Self-pay

## 2022-03-13 ENCOUNTER — Other Ambulatory Visit (HOSPITAL_COMMUNITY): Payer: Self-pay

## 2022-03-13 MED ORDER — OZEMPIC (1 MG/DOSE) 4 MG/3ML ~~LOC~~ SOPN
1.0000 mg | PEN_INJECTOR | SUBCUTANEOUS | 0 refills | Status: DC
  Filled 2022-03-13: qty 9, 84d supply, fill #0
  Filled 2022-03-13: qty 3, 28d supply, fill #0
  Filled 2022-03-13: qty 9, 84d supply, fill #0
  Filled 2022-04-03: qty 3, 28d supply, fill #1

## 2022-03-13 NOTE — Telephone Encounter (Signed)
Called [facility] and they had 1 mg in stock.  Reached out to patient and she has been informed to pick up script there.

## 2022-03-14 ENCOUNTER — Other Ambulatory Visit (HOSPITAL_COMMUNITY): Payer: Self-pay

## 2022-03-19 NOTE — Patient Instructions (Signed)

## 2022-03-19 NOTE — Progress Notes (Unsigned)
Subjective:   Alicia Hunt is a 66 y.o. female who presents for an Initial Medicare Annual Wellness Visit. I connected with  Alicia Hunt on 03/20/22 by a audio enabled telemedicine application and verified that I am speaking with the correct person using two identifiers.  Patient Location: Home  Provider Location: Home Office  I discussed the limitations of evaluation and management by telemedicine. The patient expressed understanding and agreed to proceed.  Review of Systems    Deferred to PCP Cardiac Risk Factors include: advanced age (>35men, >43 women);dyslipidemia;hypertension     Objective:    There were no vitals filed for this visit. There is no height or weight on file to calculate BMI.     03/20/2022    3:41 PM 09/24/2021   10:43 AM 04/19/2021   11:12 AM 01/24/2020   10:20 AM  Advanced Directives  Does Patient Have a Medical Advance Directive? Yes Yes No No  Type of Estate agent of [place];Living will Healthcare Power of [place];Living will    Does patient want to make changes to medical advance directive? No - Patient declined No - Patient declined    Copy of Healthcare Power of Attorney in Chart? No - copy requested No - copy requested    Would patient like information on creating a medical advance directive?    No - Patient declined    Current Medications (verified) Outpatient Encounter Medications as of 03/20/2022  Medication Sig   amLODipine-olmesartan (AZOR) 5-40 MG tablet TAKE 1 TABLET BY MOUTH  DAILY   atorvastatin (LIPITOR) 10 MG tablet TAKE 1 TABLET BY MOUTH  DAILY   Cholecalciferol (VITAMIN D3 PO) Take by mouth.   Cyanocobalamin (VITAMIN B-12 PO) Take by mouth.   escitalopram (LEXAPRO) 10 MG tablet TAKE 1 TABLET BY MOUTH  DAILY   levothyroxine (SYNTHROID) 50 MCG tablet TAKE 1 TABLET BY MOUTH  DAILY   Multiple Vitamin (MULTIVITAMIN) capsule Take 1 capsule by mouth daily.   Semaglutide, 1 MG/DOSE, (OZEMPIC, 1  MG/DOSE,) 4 MG/3ML SOPN Inject 1 mg into the skin once a week.   ciclopirox (PENLAC) 8 % solution Apply topically at bedtime. Apply over nail and surrounding skin. Apply daily over previous coat. After seven (7) days, may remove with alcohol and continue cycle. (Patient not taking: Reported on 03/20/2022)   Semaglutide, 2 MG/DOSE, 8 MG/3ML SOPN Inject 2 mg as directed once a week. (Patient not taking: Reported on 03/20/2022)   No facility-administered encounter medications on file as of 03/20/2022.    Allergies (verified) Patient has no known allergies.   History: Past Medical History:  Diagnosis Date   Anxiety    CPAP (continuous positive airway pressure) dependence    Depression    Diverticulitis    Hyperlipidemia    Hypertension    Hypothyroidism    Sleep apnea    CPAP   Past Surgical History:  Procedure Laterality Date   ABDOMINAL HYSTERECTOMY     BREAST EXCISIONAL BIOPSY Left 20+ years   pt states dr didnt remove lump   CHOLECYSTECTOMY     COLONOSCOPY     12 years ago-Diverticulitis bleed in [place]   Family History  Problem Relation Age of Onset   Stroke Mother 59       cause of death   Hypertension Mother    Hypertension Father    Breast cancer Neg Hx    Colon cancer Neg Hx    Colon polyps Neg Hx    Esophageal cancer  Neg Hx    Stomach cancer Neg Hx    Rectal cancer Neg Hx    Social History   Socioeconomic History   Marital status: Married    Spouse name: Environmental health practitioner   Number of children: 2   Years of education: Not on file   Highest education level: Not on file  Occupational History   Occupation: housewife  Tobacco Use   Smoking status: Never   Smokeless tobacco: Never  Vaping Use   Vaping Use: Never used  Substance and Sexual Activity   Alcohol use: Yes    Alcohol/week: 1.0 standard drink of alcohol    Types: 1 Glasses of Addley Ballinger per week    Comment: Jacqulyn Barresi occ-social   Drug use: Never   Sexual activity: Not Currently  Other Topics Concern   Not on file   Social History Narrative   Lives at home with husband   R handed   Caffeine: 2 C of coffee a day   Social Determinants of Health   Financial Resource Strain: Low Risk  (03/20/2022)   Overall Financial Resource Strain (CARDIA)    Difficulty of Paying Living Expenses: Not hard at all  Food Insecurity: No Food Insecurity (03/20/2022)   Hunger Vital Sign    Worried About Running Out of Food in the Last Year: Never true    Ran Out of Food in the Last Year: Never true  Transportation Needs: No Transportation Needs (03/20/2022)   PRAPARE - Administrator, Civil Service (Medical): No    Lack of Transportation (Non-Medical): No  Physical Activity: Sufficiently Active (03/20/2022)   Exercise Vital Sign    Days of Exercise per Week: 4 days    Minutes of Exercise per Session: 40 min  Stress: No Stress Concern Present (03/20/2022)   [employer] of Occupational Health - Occupational Stress Questionnaire    Feeling of Stress : Not at all  Social Connections: Socially Integrated (03/20/2022)   Social Connection and Isolation Panel [NHANES]    Frequency of Communication with Friends and Family: More than three times a week    Frequency of Social Gatherings with Friends and Family: More than three times a week    Attends Religious Services: 1 to 4 times per year    Active Member of [employer] or Organizations: Yes    Attends Banker Meetings: 1 to 4 times per year    Marital Status: Married    Tobacco Counseling Counseling given: Not Answered   Clinical Intake:  Pre-visit preparation completed: Yes  Pain : No/denies pain     Nutritional Status: BMI > 30  Obese Nutritional Risks: None Diabetes: No  How often do you need to have someone help you when you read instructions, pamphlets, or other written materials from your doctor or pharmacy?: 1 - Never What is the last grade level you completed in school?: college  Diabetic?No  Interpreter Needed?:  No  Information entered by :: Blanchie Serve RN   Activities of Daily Living    03/20/2022    3:40 PM  In your present state of health, do you have any difficulty performing the following activities:  Hearing? 0  Vision? 0  Difficulty concentrating or making decisions? 0  Walking or climbing stairs? 0  Dressing or bathing? 0  Doing errands, shopping? 0  Preparing Food and eating ? N  Using the Toilet? N  In the past six months, have you accidently leaked urine? N  Do you have problems  with loss of bowel control? N  Managing your Medications? N  Managing your Finances? N  Housekeeping or managing your Housekeeping? N    Patient Care Team: Binnie Rail, MD as PCP - General (Internal Medicine)  Indicate any recent Medical Services you may have received from other than [facility] providers in the past year (date may be approximate).     Assessment:   This is a routine wellness examination for Alicia Hunt.  Hearing/Vision screen No results found.  Dietary issues and exercise activities discussed: Current Exercise Habits: Home exercise routine, Type of exercise: walking;strength training/weights, Time (Minutes): 35, Frequency (Times/Week): 4, Weekly Exercise (Minutes/Week): 140, Intensity: Mild, Exercise limited by: None identified   Goals Addressed             This Visit's Progress    Patient Stated       Continue to lose weight with goal of decreasing medication.      Depression Screen    03/20/2022    3:38 PM 08/26/2021   10:54 AM 11/12/2020    3:47 PM 03/06/2020    4:41 PM 11/22/2018    2:29 PM 11/22/2018    2:28 PM  PHQ 2/9 Scores  PHQ - 2 Score 0 0 0 0 0 0  PHQ- 9 Score     0     Fall Risk    03/20/2022    3:42 PM 08/26/2021   10:54 AM 11/12/2020    3:47 PM 09/13/2019   11:18 AM 11/22/2018    2:28 PM  [place] in the past year? 0 0 0 0 0  Number falls in past yr: 0   0 0  Injury with Fall? 0  0 0   Risk for fall due to : No Fall Risks      Follow up Falls  evaluation completed   Falls evaluation completed     [place]:  Any stairs in or around the home? Yes  If so, are there any without handrails? Yes  Home free of loose throw rugs in walkways, pet beds, electrical cords, etc? Yes  Adequate lighting in your home to reduce risk of falls? Yes   ASSISTIVE DEVICES UTILIZED TO PREVENT FALLS:  Life alert? No  Use of a cane, walker or w/c? No  Grab bars in the bathroom? No  Shower chair or bench in shower? No  Elevated toilet seat or a handicapped toilet? No   Cognitive Function:        03/20/2022    3:42 PM  6CIT Screen  What Year? 0 points  What month? 0 points  What time? 0 points  Count back from 20 0 points  Months in reverse 0 points  Repeat phrase 0 points  Total Score 0 points    Immunizations Immunization History  Administered Date(s) Administered   Fluad Quad(high Dose 65+) 02/24/2019, 03/06/2020, 02/26/2022   PFIZER(Purple Top)SARS-COV-2 Vaccination 06/01/2019, 06/20/2019   PNEUMOCOCCAL CONJUGATE-20 02/26/2022    TDAP status: Due, Education has been provided regarding the importance of this vaccine. Advised may receive this vaccine at local pharmacy or Health Dept. Aware to provide a copy of the vaccination record if obtained from local pharmacy or Health Dept. Verbalized acceptance and understanding.  Flu Vaccine status: Up to date  Pneumococcal vaccine status: Up to date  Covid-19 vaccine status: Information provided on how to obtain vaccines.   Qualifies for Shingles Vaccine? Yes   Zostavax completed No  Shingrix Completed?: No.    Education has been provided regarding the importance of this vaccine. Patient has been advised to call insurance company to determine out of pocket expense if they have not yet received this vaccine. Advised may also receive vaccine at local pharmacy or Health Dept. Verbalized acceptance and understanding.  Screening Tests Health Maintenance   Topic Date Due   TETANUS/TDAP  Never done   Zoster Vaccines- Shingrix (1 of 2) Never done   COVID-19 Vaccine (3 - Pfizer series) 08/15/2019   Hepatitis C Screening  09/13/2050 (Originally 11/01/1973)   DEXA SCAN  03/16/2023   Medicare Annual Wellness (AWV)  03/21/2023   MAMMOGRAM  05/18/2023   Pneumonia Vaccine 2+ Years old  Completed   INFLUENZA VACCINE  Completed   HPV VACCINES  Aged Out   COLONOSCOPY (Pts 45-70yrs Insurance coverage will need to be confirmed)  Discontinued    Health Maintenance  Health Maintenance Due  Topic Date Due   TETANUS/TDAP  Never done   Zoster Vaccines- Shingrix (1 of 2) Never done   COVID-19 Vaccine (3 - Pfizer series) 08/15/2019    Colorectal cancer screening: Type of screening: Colonoscopy. Completed 02/17/22. Repeat every never years  Mammogram status: Completed 05/17/21. Repeat every year  Bone Density status: Completed 03/15/21. Results reflect: Bone density results: OSTEOPENIA. Repeat every 2 years.  Lung Cancer Screening: (Low Dose CT Chest recommended if Age 77-80 years, 30 pack-year currently smoking OR have quit w/in 15years.) does not qualify.   Additional Screening:  Hepatitis C Screening: does qualify; Completed education provided  Vision Screening: Recommended annual ophthalmology exams for early detection of glaucoma and other disorders of the eye. Is the patient up to date with their annual eye exam?  Yes  Who is the provider or what is the name of the office in which the patient attends annual eye exams? [facility] If pt is not established with a provider, would they like to be referred to a provider to establish care?  N/A .   Dental Screening: Recommended annual dental exams for proper oral hygiene  Community Resource Referral / Chronic Care Management: CRR required this visit?  No   CCM required this visit?  No      Plan:     I have personally reviewed and noted the following in the patient's chart:    Medical and social history Use of alcohol, tobacco or illicit drugs  Current medications and supplements including opioid prescriptions. Patient is not currently taking opioid prescriptions. Functional ability and status Nutritional status Physical activity Advanced directives List of other physicians Hospitalizations, surgeries, and ER visits in previous 12 months Vitals Screenings to include cognitive, depression, and falls Referrals and appointments  In addition, I have reviewed and discussed with patient certain preventive protocols, quality metrics, and best practice recommendations. A written personalized care plan for preventive services as well as general preventive health recommendations were provided to patient.     Wanda Plump, RN   03/20/2022   Nurse Notes:  Ms. Vajda , Thank you for taking time to come for your Medicare Wellness Visit. I appreciate your ongoing commitment to your health goals. Please review the following plan we discussed and let me know if I can assist you in the future.   These are the goals we discussed:  Goals      Patient Stated     Continue to lose weight with goal of decreasing medication.        This is  a list of the screening recommended for you and due dates:  Health Maintenance  Topic Date Due   Tetanus Vaccine  Never done   Zoster (Shingles) Vaccine (1 of 2) Never done   COVID-19 Vaccine (3 - Pfizer series) 08/15/2019   Hepatitis C Screening: USPSTF Recommendation to screen - Ages 18-79 yo.  09/13/2050*   DEXA scan (bone density measurement)  03/16/2023   Medicare Annual Wellness Visit  03/21/2023   Mammogram  05/18/2023   Pneumonia Vaccine  Completed   Flu Shot  Completed   HPV Vaccine  Aged Out   Colon Cancer Screening  Discontinued  *Topic was postponed. The date shown is not the original due date.

## 2022-03-20 ENCOUNTER — Ambulatory Visit (INDEPENDENT_AMBULATORY_CARE_PROVIDER_SITE_OTHER): Payer: Medicare Other | Admitting: *Deleted

## 2022-03-20 DIAGNOSIS — Z0000 Encounter for general adult medical examination without abnormal findings: Secondary | ICD-10-CM

## 2022-03-30 DIAGNOSIS — F32A Depression, unspecified: Secondary | ICD-10-CM | POA: Diagnosis not present

## 2022-03-30 DIAGNOSIS — G4733 Obstructive sleep apnea (adult) (pediatric): Secondary | ICD-10-CM | POA: Diagnosis not present

## 2022-03-30 DIAGNOSIS — R0683 Snoring: Secondary | ICD-10-CM | POA: Diagnosis not present

## 2022-03-30 DIAGNOSIS — I10 Essential (primary) hypertension: Secondary | ICD-10-CM | POA: Diagnosis not present

## 2022-04-02 ENCOUNTER — Telehealth: Payer: Self-pay | Admitting: Internal Medicine

## 2022-04-02 NOTE — Telephone Encounter (Signed)
Patient has been unable to locate the ozempic 1mg   -she states that you told her to call back and and that you would help her locate it.

## 2022-04-04 ENCOUNTER — Other Ambulatory Visit (HOSPITAL_COMMUNITY): Payer: Self-pay

## 2022-04-04 ENCOUNTER — Other Ambulatory Visit: Payer: Self-pay

## 2022-04-04 MED ORDER — SEMAGLUTIDE (2 MG/DOSE) 8 MG/3ML ~~LOC~~ SOPN
2.0000 mg | PEN_INJECTOR | SUBCUTANEOUS | 3 refills | Status: DC
  Filled 2022-04-04: qty 3, 28d supply, fill #0
  Filled 2022-04-25: qty 6, 56d supply, fill #1

## 2022-04-04 NOTE — Telephone Encounter (Signed)
Spoke with patient today.  Called [place] and they had the 2mg  and new script sent in.

## 2022-04-21 NOTE — Progress Notes (Unsigned)
[facility] [address] [place]. [place] [address] [phone]       OFFICE FOLLOW UP NOTE  [name] Date of Birth:  26-Jan-1956 Medical Record Number:  [id number]   Reason for visit: Initial CPAP follow-up    SUBJECTIVE:   CHIEF COMPLAINT:  No chief complaint on file.   HPI:   Update 04/22/2022 JM: Patient returns for initial CPAP compliance visit.  Completed HST 01/09/2022 which confirmed presence of severe OSA with total AHI 42.7/h and received new CPAP machine on 10/12.         Consult visit 01/07/2022 Dr. Brett Hunt: Alicia Hunt is a 66 y.o. female patient seen here as a referral on 01/07/2022 from Dr Alicia Burow, MD.  .  Chief concern according to patient : "Patient was seen last in 2017 , at the time diagnosed with OSA and started CPAP in 2017. This summer , while vacationing in [place] ,her CPAP broke down, the repair did cost 300 dollars and she is in need for new machine. Would like to try a new mask, uses a FFM "     Alicia Hunt  has a past medical history of Anxiety, CPAP (continuous positive airway pressure) dependence, Depression, Diverticulitis, Hyperlipidemia, Hypertension, Hypothyroidism, and Sleep apnea.   The patient had the first sleep study in the year 2017.    Sleep relevant medical history: ON CPAP: no longer snoring, no longer dry mouth, no longer nocturia. No more morning headaches. Mother passed at age 27 with major stroke.    Social history:  Patient is working as a Materials engineer. She  lives in a household with husband , no pets.  Tobacco use- never .  ETOH use: none ,  Caffeine intake in form of Coffee( 1-2 cups in AM) Soda( /) Tea ( /) or energy drinks. Regular exercise in form of walking.  Working out - improved sleep.  Hobbies :cooking.   Sleep habits are as follows: The patient's dinner time is between 7 PM. The patient goes to bed at 11 PM and continues to sleep for 7 hours, wakes rarely  for bathroom breaks, the first time at 4 AM.   The preferred sleep position is laterally, with the support of 1 pillows. Dreams are reportedly frequent/vivid.  The patient wakes up spontaneously at 8.30- 8.30   AM is the usual rise time.   She reports feeling refreshed /restored in AM, ever since being on CPAP.   CPAP compliance data:    This is Alicia Hunt has been a highly compliant CPAP user, 97% by days and 93% by time. Machine is an Education officer, community between 4-10 cm water, residual AHI 0.7 /h.      ROS:   14 system review of systems performed and negative with exception of those listed in HPI  PMH:  Past Medical History:  Diagnosis Date   Anxiety    CPAP (continuous positive airway pressure) dependence    Depression    Diverticulitis    Hyperlipidemia    Hypertension    Hypothyroidism    Sleep apnea    CPAP    PSH:  Past Surgical History:  Procedure Laterality Date   ABDOMINAL HYSTERECTOMY     BREAST EXCISIONAL BIOPSY Left 20+ years   pt states dr didnt remove lump   CHOLECYSTECTOMY     COLONOSCOPY     12 years ago-Diverticulitis bleed in [place]    Social History:  Social History   Socioeconomic History  Marital status: Married    Spouse name: Alicia Hunt   Number of children: 2   Years of education: Not on file   Highest education level: Not on file  Occupational History   Occupation: housewife  Tobacco Use   Smoking status: Never   Smokeless tobacco: Never  Vaping Use   Vaping Use: Never used  Substance and Sexual Activity   Alcohol use: Yes    Alcohol/week: 1.0 standard drink of alcohol    Types: 1 Glasses of wine per week    Comment: wine occ-social   Drug use: Never   Sexual activity: Not Currently  Other Topics Concern   Not on file  Social History Narrative   Lives at home with husband   R handed   Caffeine: 2 C of coffee a day   Social Determinants of Health   Financial Resource Strain: Low Risk  (03/20/2022)   Overall Financial Resource  Strain (CARDIA)    Difficulty of Paying Living Expenses: Not hard at all  Food Insecurity: No Food Insecurity (03/20/2022)   Hunger Vital Sign    Worried About Running Out of Food in the Last Year: Never true    Ran Out of Food in the Last Year: Never true  Transportation Needs: No Transportation Needs (03/20/2022)   PRAPARE - Administrator, Civil Service (Medical): No    Lack of Transportation (Non-Medical): No  Physical Activity: Sufficiently Active (03/20/2022)   Exercise Vital Sign    Days of Exercise per Week: 4 days    Minutes of Exercise per Session: 40 min  Stress: No Stress Concern Present (03/20/2022)   [employer] of Occupational Health - Occupational Stress Questionnaire    Feeling of Stress : Not at all  Social Connections: Socially Integrated (03/20/2022)   Social Connection and Isolation Panel [NHANES]    Frequency of Communication with Friends and Family: More than three times a week    Frequency of Social Gatherings with Friends and Family: More than three times a week    Attends Religious Services: 1 to 4 times per year    Active Member of [employer] or Organizations: Yes    Attends Banker Meetings: 1 to 4 times per year    Marital Status: Married  Catering manager Violence: Not At Risk (03/20/2022)   Humiliation, Afraid, Rape, and Kick questionnaire    Fear of Current or Ex-Partner: No    Emotionally Abused: No    Physically Abused: No    Sexually Abused: No    Family History:  Family History  Problem Relation Age of Onset   Stroke Mother 71       cause of death   Hypertension Mother    Hypertension Father    Breast cancer Neg Hx    Colon cancer Neg Hx    Colon polyps Neg Hx    Esophageal cancer Neg Hx    Stomach cancer Neg Hx    Rectal cancer Neg Hx     Medications:   Current Outpatient Medications on File Prior to Visit  Medication Sig Dispense Refill   amLODipine-olmesartan (AZOR) 5-40 MG tablet TAKE 1 TABLET BY MOUTH   DAILY 100 tablet 2   atorvastatin (LIPITOR) 10 MG tablet TAKE 1 TABLET BY MOUTH  DAILY 90 tablet 3   Cholecalciferol (VITAMIN D3 PO) Take by mouth.     ciclopirox (PENLAC) 8 % solution Apply topically at bedtime. Apply over nail and surrounding skin. Apply daily over previous coat.  After seven (7) days, may remove with alcohol and continue cycle. (Patient not taking: Reported on 03/20/2022) 18 mL 0   Cyanocobalamin (VITAMIN B-12 PO) Take by mouth.     escitalopram (LEXAPRO) 10 MG tablet TAKE 1 TABLET BY MOUTH  DAILY 90 tablet 3   levothyroxine (SYNTHROID) 50 MCG tablet TAKE 1 TABLET BY MOUTH  DAILY 90 tablet 3   Multiple Vitamin (MULTIVITAMIN) capsule Take 1 capsule by mouth daily.     Semaglutide, 2 MG/DOSE, 8 MG/3ML SOPN Inject 2 mg as directed once a week. 9 mL 3   No current facility-administered medications on file prior to visit.    Allergies:  No Known Allergies    OBJECTIVE:  Physical Exam  There were no vitals filed for this visit. There is no height or weight on file to calculate BMI. No results found.   General: well developed, well nourished, seated, in no evident distress Head: head normocephalic and atraumatic.   Neck: supple with no carotid or supraclavicular bruits Cardiovascular: regular rate and rhythm, no murmurs Musculoskeletal: no deformity Skin:  no rash/petichiae Vascular:  Normal pulses all extremities   Neurologic Exam Mental Status: Awake and fully alert. Oriented to place and time. Recent and remote memory intact. Attention span, concentration and fund of knowledge appropriate. Mood and affect appropriate.  Cranial Nerves: Pupils equal, briskly reactive to light. Extraocular movements full without nystagmus. Visual fields full to confrontation. Hearing intact. Facial sensation intact. Face, tongue, palate moves normally and symmetrically.  Motor: Normal bulk and tone. Normal strength in all tested extremity muscles Sensory.: intact to touch , pinprick ,  position and vibratory sensation.  Coordination: Rapid alternating movements normal in all extremities. Finger-to-nose and heel-to-shin performed accurately bilaterally. Gait and Station: Arises from chair without difficulty. Stance is normal. Gait demonstrates normal stride length and balance without use of AD. Tandem walk and heel toe without difficulty.  Reflexes: 1+ and symmetric. Toes downgoing.         ASSESSMENT/PLAN: Jannat Blood is a 66 y.o. year old female with longstanding history of severe sleep apnea reconfirmed per HST 01/09/2022 on CPAP    OSA on CPAP : Compliance report shows satisfactory usage with optimal residual AHI.  Discussed continued nightly usage with ensuring greater than 4 hours nightly for optimal benefit and per insurance purposes.  Continue to follow with DME company for any needed supplies or CPAP related concerns     Follow up in *** or call earlier if needed   CC:  PCP: Binnie Rail, MD    I spent *** minutes of face-to-face and non-face-to-face time with patient.  This included previsit chart review, lab review, study review, order entry, electronic health record documentation, patient education regarding diagnosis of sleep apnea with review and discussion of compliance report and answered all other questions to patient's satisfaction   Frann Rider, [facility]  [facility] [address] [place], [place] [address]  Phone [phone] Fax [phone] Note: This document was prepared with digital dictation and possible smart phrase technology. Any transcriptional errors that result from this process are unintentional.

## 2022-04-22 ENCOUNTER — Ambulatory Visit: Payer: Medicare Other | Admitting: Adult Health

## 2022-04-22 ENCOUNTER — Encounter: Payer: Self-pay | Admitting: Adult Health

## 2022-04-22 VITALS — BP 121/73 | HR 68 | Ht 64.0 in | Wt 193.0 lb

## 2022-04-22 DIAGNOSIS — G4733 Obstructive sleep apnea (adult) (pediatric): Secondary | ICD-10-CM | POA: Diagnosis not present

## 2022-04-22 NOTE — Patient Instructions (Addendum)
Your Plan:  Continue nightly use of CPAP with ensuring greater than 4 hours nightly for optimal benefit and per insurance requirements  Please follow up with [employer] [facility] regarding your water chamber running out of water quickly - unsure if this is a machine issue or environment issue. Can also try using a room humidification system to see if this helps.   Continue to follow with your DME company for any needed supplies or CPAP related concerns     Follow-up in 1 year or call earlier if needed     Thank you for coming to see [place] at [facility]. I hope we have been able to provide you high quality care today.  You may receive a patient satisfaction survey over the next few weeks. We would appreciate your feedback and comments so that we may continue to improve ourselves and the health of our patients.

## 2022-04-25 ENCOUNTER — Other Ambulatory Visit (HOSPITAL_COMMUNITY): Payer: Self-pay

## 2022-04-28 ENCOUNTER — Other Ambulatory Visit (HOSPITAL_COMMUNITY): Payer: Self-pay

## 2022-04-29 DIAGNOSIS — I10 Essential (primary) hypertension: Secondary | ICD-10-CM | POA: Diagnosis not present

## 2022-04-29 DIAGNOSIS — G4733 Obstructive sleep apnea (adult) (pediatric): Secondary | ICD-10-CM | POA: Diagnosis not present

## 2022-04-29 DIAGNOSIS — F32A Depression, unspecified: Secondary | ICD-10-CM | POA: Diagnosis not present

## 2022-04-29 DIAGNOSIS — R0683 Snoring: Secondary | ICD-10-CM | POA: Diagnosis not present

## 2022-05-30 DIAGNOSIS — F32A Depression, unspecified: Secondary | ICD-10-CM | POA: Diagnosis not present

## 2022-05-30 DIAGNOSIS — G4733 Obstructive sleep apnea (adult) (pediatric): Secondary | ICD-10-CM | POA: Diagnosis not present

## 2022-05-30 DIAGNOSIS — I10 Essential (primary) hypertension: Secondary | ICD-10-CM | POA: Diagnosis not present

## 2022-05-30 DIAGNOSIS — R0683 Snoring: Secondary | ICD-10-CM | POA: Diagnosis not present

## 2022-06-30 DIAGNOSIS — I10 Essential (primary) hypertension: Secondary | ICD-10-CM | POA: Diagnosis not present

## 2022-06-30 DIAGNOSIS — G4733 Obstructive sleep apnea (adult) (pediatric): Secondary | ICD-10-CM | POA: Diagnosis not present

## 2022-06-30 DIAGNOSIS — F32A Depression, unspecified: Secondary | ICD-10-CM | POA: Diagnosis not present

## 2022-06-30 DIAGNOSIS — R0683 Snoring: Secondary | ICD-10-CM | POA: Diagnosis not present

## 2022-07-15 DIAGNOSIS — I10 Essential (primary) hypertension: Secondary | ICD-10-CM | POA: Diagnosis not present

## 2022-07-15 DIAGNOSIS — G4733 Obstructive sleep apnea (adult) (pediatric): Secondary | ICD-10-CM | POA: Diagnosis not present

## 2022-07-29 DIAGNOSIS — R0683 Snoring: Secondary | ICD-10-CM | POA: Diagnosis not present

## 2022-07-29 DIAGNOSIS — I10 Essential (primary) hypertension: Secondary | ICD-10-CM | POA: Diagnosis not present

## 2022-07-29 DIAGNOSIS — G4733 Obstructive sleep apnea (adult) (pediatric): Secondary | ICD-10-CM | POA: Diagnosis not present

## 2022-07-29 DIAGNOSIS — F32A Depression, unspecified: Secondary | ICD-10-CM | POA: Diagnosis not present

## 2022-08-01 ENCOUNTER — Other Ambulatory Visit: Payer: Self-pay

## 2022-08-01 ENCOUNTER — Other Ambulatory Visit: Payer: Self-pay | Admitting: Internal Medicine

## 2022-08-27 ENCOUNTER — Encounter: Payer: Self-pay | Admitting: Internal Medicine

## 2022-08-27 NOTE — Patient Instructions (Addendum)
Blood work was ordered.   The lab is on the first floor.    Medications changes include :   None      Return in about 6 months (around 02/27/2023) for follow up.   Health Maintenance, Female Adopting a healthy lifestyle and getting preventive care are important in promoting health and wellness. Ask your health care provider about: The right schedule for you to have regular tests and exams. Things you can do on your own to prevent diseases and keep yourself healthy. What should I know about diet, weight, and exercise? Eat a healthy diet  Eat a diet that includes plenty of vegetables, fruits, low-fat dairy products, and lean protein. Do not eat a lot of foods that are high in solid fats, added sugars, or sodium. Maintain a healthy weight Body mass index (BMI) is used to identify weight problems. It estimates body fat based on height and weight. Your health care provider can help determine your BMI and help you achieve or maintain a healthy weight. Get regular exercise Get regular exercise. This is one of the most important things you can do for your health. Most adults should: Exercise for at least 150 minutes each week. The exercise should increase your heart rate and make you sweat (moderate-intensity exercise). Do strengthening exercises at least twice a week. This is in addition to the moderate-intensity exercise. Spend less time sitting. Even light physical activity can be beneficial. Watch cholesterol and blood lipids Have your blood tested for lipids and cholesterol at 69 years of age, then have this test every 5 years. Have your cholesterol levels checked more often if: Your lipid or cholesterol levels are high. You are older than 69 years of age. You are at high risk for heart disease. What should I know about cancer screening? Depending on your health history and family history, you may need to have cancer screening at various ages. This may include screening  for: Breast cancer. Cervical cancer. Colorectal cancer. Skin cancer. Lung cancer. What should I know about heart disease, diabetes, and high blood pressure? Blood pressure and heart disease High blood pressure causes heart disease and increases the risk of stroke. This is more likely to develop in people who have high blood pressure readings or are overweight. Have your blood pressure checked: Every 3-5 years if you are 27-71 years of age. Every year if you are 50 years old or older. Diabetes Have regular diabetes screenings. This checks your fasting blood sugar level. Have the screening done: Once every three years after age 31 if you are at a normal weight and have a low risk for diabetes. More often and at a younger age if you are overweight or have a high risk for diabetes. What should I know about preventing infection? Hepatitis B If you have a higher risk for hepatitis B, you should be screened for this virus. Talk with your health care provider to find out if you are at risk for hepatitis B infection. Hepatitis C Testing is recommended for: Everyone born from [birth year] through 1967. Anyone with known risk factors for hepatitis C. Sexually transmitted infections (STIs) Get screened for STIs, including gonorrhea and chlamydia, if: You are sexually active and are younger than 69 years of age. You are older than 69 years of age and your health care provider tells you that you are at risk for this type of infection. Your sexual activity has changed since you were last screened, and you are  at increased risk for chlamydia or gonorrhea. Ask your health care provider if you are at risk. Ask your health care provider about whether you are at high risk for HIV. Your health care provider may recommend a prescription medicine to help prevent HIV infection. If you choose to take medicine to prevent HIV, you should first get tested for HIV. You should then be tested every 3 months for as long as you  are taking the medicine. Pregnancy If you are about to stop having your period (premenopausal) and you may become pregnant, seek counseling before you get pregnant. Take 400 to 800 micrograms (mcg) of folic acid every day if you become pregnant. Ask for birth control (contraception) if you want to prevent pregnancy. Osteoporosis and menopause Osteoporosis is a disease in which the bones lose minerals and strength with aging. This can result in bone fractures. If you are 39 years old or older, or if you are at risk for osteoporosis and fractures, ask your health care provider if you should: Be screened for bone loss. Take a calcium or vitamin D supplement to lower your risk of fractures. Be given hormone replacement therapy (HRT) to treat symptoms of menopause. Follow these instructions at home: Alcohol use Do not drink alcohol if: Your health care provider tells you not to drink. You are pregnant, may be pregnant, or are planning to become pregnant. If you drink alcohol: Limit how much you have to: 0-1 drink a day. Know how much alcohol is in your drink. In the [place] one drink equals one 12 oz bottle of beer (355 mL), one 5 oz glass of wine (148 mL), or one 1 oz glass of hard liquor (44 mL). Lifestyle Do not use any products that contain nicotine or tobacco. These products include cigarettes, chewing tobacco, and vaping devices, such as e-cigarettes. If you need help quitting, ask your health care provider. Do not use street drugs. Do not share needles. Ask your health care provider for help if you need support or information about quitting drugs. General instructions Schedule regular health, dental, and eye exams. Stay current with your vaccines. Tell your health care provider if: You often feel depressed. You have ever been abused or do not feel safe at home. Summary Adopting a healthy lifestyle and getting preventive care are important in promoting health and wellness. Follow your  health care provider's instructions about healthy diet, exercising, and getting tested or screened for diseases. Follow your health care provider's instructions on monitoring your cholesterol and blood pressure. This information is not intended to replace advice given to you by your health care provider. Make sure you discuss any questions you have with your health care provider. Document Revised: 09/24/2020 Document Reviewed: 09/24/2020 Elsevier Patient Education  2023 [employer].

## 2022-08-27 NOTE — Progress Notes (Unsigned)
Subjective:    Patient ID: Alicia Hunt, female    DOB: [birth year], 67 y.o.   MRN: [id number]      HPI Alicia Hunt is here for a Physical exam and her chronic medical problems.    Overall doing well.  She is lost 25 pounds and she is very happy.  Medications and allergies reviewed with patient and updated if appropriate.  Current Outpatient Medications on File Prior to Visit  Medication Sig Dispense Refill   amLODipine-olmesartan (AZOR) 5-40 MG tablet TAKE 1 TABLET BY MOUTH  DAILY 100 tablet 2   atorvastatin (LIPITOR) 10 MG tablet TAKE 1 TABLET BY MOUTH  DAILY 90 tablet 3   Cholecalciferol (VITAMIN D3 PO) Take by mouth.     Cyanocobalamin (VITAMIN B-12 PO) Take by mouth.     escitalopram (LEXAPRO) 10 MG tablet TAKE 1 TABLET BY MOUTH  DAILY 90 tablet 3   levothyroxine (SYNTHROID) 50 MCG tablet TAKE 1 TABLET BY MOUTH  DAILY 100 tablet 2   Multiple Vitamin (MULTIVITAMIN) capsule Take 1 capsule by mouth daily.     Semaglutide, 2 MG/DOSE, 8 MG/3ML SOPN Inject 2 mg as directed once a week. 9 mL 3   No current facility-administered medications on file prior to visit.    Review of Systems  Constitutional:  Negative for fever.  Eyes:  Negative for visual disturbance.  Respiratory:  Negative for cough, shortness of breath and wheezing.   Cardiovascular:  Negative for chest pain, palpitations and leg swelling.  Gastrointestinal:  Positive for constipation and diarrhea (alternating diarrrhea, constipation). Negative for abdominal pain and blood in stool.       No gerd.  Increased gas  Genitourinary:  Negative for dysuria.  Musculoskeletal:  Negative for arthralgias and back pain.  Skin:  Negative for rash.  Neurological:  Negative for light-headedness and headaches.  Psychiatric/Behavioral:  Negative for dysphoric mood. The patient is not nervous/anxious.        Objective:   Vitals:   08/28/22 1110  BP: 128/76  Pulse: 65  Temp: 98.6 F (37 C)  SpO2: 99%   Filed  Weights   08/28/22 1110  Weight: 188 lb (85.3 kg)   Body mass index is 32.27 kg/m.  BP Readings from Last 3 Encounters:  08/28/22 128/76  04/22/22 121/73  02/26/22 130/76    Wt Readings from Last 3 Encounters:  08/28/22 188 lb (85.3 kg)  04/22/22 193 lb (87.5 kg)  02/26/22 193 lb (87.5 kg)       Physical Exam Constitutional: She appears well-developed and well-nourished. No distress.  HENT:  Head: Normocephalic and atraumatic.  Right Ear: External ear normal. Normal ear canal and TM Left Ear: External ear normal.  Normal ear canal and TM Mouth/Throat: Oropharynx is clear and moist.  Eyes: Conjunctivae normal.  Neck: Neck supple. No tracheal deviation present. No thyromegaly present.  No carotid bruit  Cardiovascular: Normal rate, regular rhythm and normal heart sounds.   No murmur heard.  No edema. Pulmonary/Chest: Effort normal and breath sounds normal. No respiratory distress. She has no wheezes. She has no rales.  Breast: deferred   Abdominal: Soft. She exhibits no distension. There is no tenderness.  Lymphadenopathy: She has no cervical adenopathy.  Skin: Skin is warm and dry. She is not diaphoretic.  Psychiatric: She has a normal mood and affect. Her behavior is normal.     Lab Results  Component Value Date   WBC 5.9 02/26/2022   HGB 14.1 02/26/2022  HCT 42.2 02/26/2022   PLT 224.0 02/26/2022   GLUCOSE 91 02/26/2022   CHOL 137 02/26/2022   TRIG 132.0 02/26/2022   HDL 47.10 02/26/2022   LDLDIRECT 79.0 11/12/2020   LDLCALC 64 02/26/2022   ALT 24 02/26/2022   AST 24 02/26/2022   NA 139 02/26/2022   K 3.8 02/26/2022   CL 103 02/26/2022   CREATININE 0.84 02/26/2022   BUN 12 02/26/2022   CO2 29 02/26/2022   TSH 3.78 02/26/2022   HGBA1C 5.7 02/26/2022         Assessment & Plan:   Physical exam: Screening blood work  ordered Exercise  getting back to regular Weight  losing weight Substance abuse  none   Reviewed recommended  immunizations.   Health Maintenance  Topic Date Due   DTaP/Tdap/Td (1 - Tdap) Never done   COVID-19 Vaccine (3 - 2023-24 season) 09/13/2022 (Originally 01/17/2022)   Zoster Vaccines- Shingrix (1 of 2) 11/27/2022 (Originally 11/01/2003)   Hepatitis C Screening  09/13/2050 (Originally 11/01/1971)   INFLUENZA VACCINE  12/18/2022   DEXA SCAN  03/16/2023   Medicare Annual Wellness (AWV)  03/21/2023   MAMMOGRAM  05/18/2023   Pneumonia Vaccine 107+ Years old  Completed   HPV VACCINES  Aged Out   COLONOSCOPY (Pts 45-50yrs Insurance coverage will need to be confirmed)  Discontinued          See Problem List for Assessment and Plan of chronic medical problems.

## 2022-08-28 ENCOUNTER — Ambulatory Visit (INDEPENDENT_AMBULATORY_CARE_PROVIDER_SITE_OTHER): Payer: Medicare Other | Admitting: Internal Medicine

## 2022-08-28 ENCOUNTER — Encounter: Payer: Self-pay | Admitting: Internal Medicine

## 2022-08-28 VITALS — BP 128/76 | HR 65 | Temp 98.6°F | Ht 64.0 in | Wt 188.0 lb

## 2022-08-28 DIAGNOSIS — M8588 Other specified disorders of bone density and structure, other site: Secondary | ICD-10-CM | POA: Diagnosis not present

## 2022-08-28 DIAGNOSIS — E559 Vitamin D deficiency, unspecified: Secondary | ICD-10-CM | POA: Diagnosis not present

## 2022-08-28 DIAGNOSIS — E039 Hypothyroidism, unspecified: Secondary | ICD-10-CM | POA: Diagnosis not present

## 2022-08-28 DIAGNOSIS — R7303 Prediabetes: Secondary | ICD-10-CM | POA: Diagnosis not present

## 2022-08-28 DIAGNOSIS — Z0000 Encounter for general adult medical examination without abnormal findings: Secondary | ICD-10-CM

## 2022-08-28 DIAGNOSIS — I10 Essential (primary) hypertension: Secondary | ICD-10-CM

## 2022-08-28 DIAGNOSIS — R7989 Other specified abnormal findings of blood chemistry: Secondary | ICD-10-CM

## 2022-08-28 DIAGNOSIS — F3289 Other specified depressive episodes: Secondary | ICD-10-CM

## 2022-08-28 DIAGNOSIS — R140 Abdominal distension (gaseous): Secondary | ICD-10-CM | POA: Diagnosis not present

## 2022-08-28 DIAGNOSIS — E7849 Other hyperlipidemia: Secondary | ICD-10-CM | POA: Diagnosis not present

## 2022-08-28 LAB — LIPID PANEL
Cholesterol: 130 mg/dL (ref 0–200)
HDL: 50.5 mg/dL (ref >39.00)
LDL Cholesterol: 57 mg/dL (ref 0–99)
NonHDL: 79.07
Total CHOL/HDL Ratio: 3
Triglycerides: 111 mg/dL (ref 0.0–149.0)
VLDL: 22.2 mg/dL (ref 0.0–40.0)

## 2022-08-28 LAB — CBC WITH DIFFERENTIAL/PLATELET
Basophils Absolute: 0 10*3/uL (ref 0.0–0.1)
Basophils Relative: 0.3 % (ref 0.0–3.0)
Eosinophils Absolute: 0.2 10*3/uL (ref 0.0–0.7)
Eosinophils Relative: 2.4 % (ref 0.0–5.0)
HCT: 44.9 % (ref 36.0–46.0)
Hemoglobin: 15.1 g/dL — ABNORMAL HIGH (ref 12.0–15.0)
Lymphocytes Relative: 28.3 % (ref 12.0–46.0)
Lymphs Abs: 1.9 10*3/uL (ref 0.7–4.0)
MCHC: 33.6 g/dL (ref 30.0–36.0)
MCV: 83.8 fl (ref 78.0–100.0)
Monocytes Absolute: 0.4 10*3/uL (ref 0.1–1.0)
Monocytes Relative: 6.5 % (ref 3.0–12.0)
Neutro Abs: 4.2 10*3/uL (ref 1.4–7.7)
Neutrophils Relative %: 62.5 % (ref 43.0–77.0)
Platelets: 259 10*3/uL (ref 150.0–400.0)
RBC: 5.36 Mil/uL — ABNORMAL HIGH (ref 3.87–5.11)
RDW: 13.8 % (ref 11.5–15.5)
WBC: 6.6 10*3/uL (ref 4.0–10.5)

## 2022-08-28 LAB — COMPREHENSIVE METABOLIC PANEL
ALT: 19 U/L (ref 0–35)
AST: 18 U/L (ref 0–37)
Albumin: 4.3 g/dL (ref 3.5–5.2)
Alkaline Phosphatase: 100 U/L (ref 39–117)
BUN: 12 mg/dL (ref 6–23)
CO2: 31 mEq/L (ref 19–32)
Calcium: 9.7 mg/dL (ref 8.4–10.5)
Chloride: 103 mEq/L (ref 96–112)
Creatinine, Ser: 0.8 mg/dL (ref 0.40–1.20)
GFR: 75.5 mL/min (ref >60.00)
Glucose, Bld: 94 mg/dL (ref 70–99)
Potassium: 4.1 mEq/L (ref 3.5–5.1)
Sodium: 142 mEq/L (ref 135–145)
Total Bilirubin: 1 mg/dL (ref 0.2–1.2)
Total Protein: 7.7 g/dL (ref 6.0–8.3)

## 2022-08-28 LAB — VITAMIN D 25 HYDROXY (VIT D DEFICIENCY, FRACTURES): VITD: 44.05 ng/mL (ref 30.00–100.00)

## 2022-08-28 LAB — VITAMIN B12: Vitamin B-12: 584 pg/mL (ref 211–911)

## 2022-08-28 LAB — HEMOGLOBIN A1C: Hgb A1c MFr Bld: 5.5 % (ref 4.6–6.5)

## 2022-08-28 LAB — TSH: TSH: 2.82 u[IU]/mL (ref 0.35–5.50)

## 2022-08-28 MED ORDER — ESCITALOPRAM OXALATE 10 MG PO TABS: 10.0000 mg | ORAL_TABLET | Freq: Every day | ORAL | 3 refills | Status: DC

## 2022-08-28 MED ORDER — ATORVASTATIN CALCIUM 10 MG PO TABS: 10.0000 mg | ORAL_TABLET | Freq: Every day | ORAL | 3 refills | Status: DC

## 2022-08-28 MED ORDER — LEVOTHYROXINE SODIUM 50 MCG PO TABS: 50.0000 ug | ORAL_TABLET | Freq: Every day | ORAL | 2 refills | Status: DC

## 2022-08-28 NOTE — Assessment & Plan Note (Signed)
Chronic DEXA up-to-date Continue calcium, vitamin D supplementation Continue regular exercise

## 2022-08-28 NOTE — Assessment & Plan Note (Signed)
Chronic ?Blood pressure well controlled ?CMP ?Continue amlodipine-olmesartan 5-40 mg daily ?

## 2022-08-28 NOTE — Assessment & Plan Note (Signed)
Chronic Taking vitamin D daily Check vitamin D level  

## 2022-08-28 NOTE — Assessment & Plan Note (Signed)
Chronic Controlled, Stable Continue Lexapro 10 mg daily 

## 2022-08-28 NOTE — Assessment & Plan Note (Signed)
Chronic  Clinically euthyroid Check tsh and will titrate med dose if needed Currently taking levothyroxine 50 mcg daily 

## 2022-08-28 NOTE — Assessment & Plan Note (Signed)
Chronic Regular exercise and healthy diet encouraged Check lipid panel, CMP, TSH Continue atorvastatin 10 mg daily 

## 2022-08-28 NOTE — Assessment & Plan Note (Signed)
Chronic Taking vitamin B12 daily-continue

## 2022-08-28 NOTE — Assessment & Plan Note (Signed)
New Likely related to Ozempic Associated with some increased gas, alternating diarrhea or constipation Seems to be improved with Pepcid Advised that she can take Pepcid daily Discussed to make sure that she is not a little constipated which may just be not having complete bowel movements but still regular bowel movements

## 2022-08-28 NOTE — Assessment & Plan Note (Signed)
Chronic Check a1c Low sugar / carb diet Stressed regular exercise Continue Ozempic 2 mg weekly 

## 2022-08-29 DIAGNOSIS — G4733 Obstructive sleep apnea (adult) (pediatric): Secondary | ICD-10-CM | POA: Diagnosis not present

## 2022-08-29 DIAGNOSIS — F32A Depression, unspecified: Secondary | ICD-10-CM | POA: Diagnosis not present

## 2022-08-29 DIAGNOSIS — R0683 Snoring: Secondary | ICD-10-CM | POA: Diagnosis not present

## 2022-08-29 DIAGNOSIS — I10 Essential (primary) hypertension: Secondary | ICD-10-CM | POA: Diagnosis not present

## 2022-09-28 DIAGNOSIS — F32A Depression, unspecified: Secondary | ICD-10-CM | POA: Diagnosis not present

## 2022-09-28 DIAGNOSIS — I10 Essential (primary) hypertension: Secondary | ICD-10-CM | POA: Diagnosis not present

## 2022-09-28 DIAGNOSIS — G4733 Obstructive sleep apnea (adult) (pediatric): Secondary | ICD-10-CM | POA: Diagnosis not present

## 2022-09-28 DIAGNOSIS — R0683 Snoring: Secondary | ICD-10-CM | POA: Diagnosis not present

## 2022-10-03 DIAGNOSIS — L821 Other seborrheic keratosis: Secondary | ICD-10-CM | POA: Diagnosis not present

## 2022-10-03 DIAGNOSIS — L910 Hypertrophic scar: Secondary | ICD-10-CM | POA: Diagnosis not present

## 2022-10-03 DIAGNOSIS — Z789 Other specified health status: Secondary | ICD-10-CM | POA: Diagnosis not present

## 2022-10-20 LAB — HEMOGLOBIN A1C: A1c: 5.8

## 2022-10-29 DIAGNOSIS — R0683 Snoring: Secondary | ICD-10-CM | POA: Diagnosis not present

## 2022-10-29 DIAGNOSIS — G4733 Obstructive sleep apnea (adult) (pediatric): Secondary | ICD-10-CM | POA: Diagnosis not present

## 2022-10-29 DIAGNOSIS — I10 Essential (primary) hypertension: Secondary | ICD-10-CM | POA: Diagnosis not present

## 2022-10-29 DIAGNOSIS — F32A Depression, unspecified: Secondary | ICD-10-CM | POA: Diagnosis not present

## 2022-11-03 DIAGNOSIS — L910 Hypertrophic scar: Secondary | ICD-10-CM | POA: Diagnosis not present

## 2022-11-03 DIAGNOSIS — Z789 Other specified health status: Secondary | ICD-10-CM | POA: Diagnosis not present

## 2022-11-28 DIAGNOSIS — F32A Depression, unspecified: Secondary | ICD-10-CM | POA: Diagnosis not present

## 2022-11-28 DIAGNOSIS — G4733 Obstructive sleep apnea (adult) (pediatric): Secondary | ICD-10-CM | POA: Diagnosis not present

## 2022-11-28 DIAGNOSIS — R0683 Snoring: Secondary | ICD-10-CM | POA: Diagnosis not present

## 2022-11-28 DIAGNOSIS — I10 Essential (primary) hypertension: Secondary | ICD-10-CM | POA: Diagnosis not present

## 2022-12-03 DIAGNOSIS — Z789 Other specified health status: Secondary | ICD-10-CM | POA: Diagnosis not present

## 2022-12-03 DIAGNOSIS — L298 Other pruritus: Secondary | ICD-10-CM | POA: Diagnosis not present

## 2022-12-03 DIAGNOSIS — R208 Other disturbances of skin sensation: Secondary | ICD-10-CM | POA: Diagnosis not present

## 2022-12-03 DIAGNOSIS — L910 Hypertrophic scar: Secondary | ICD-10-CM | POA: Diagnosis not present

## 2022-12-03 DIAGNOSIS — L538 Other specified erythematous conditions: Secondary | ICD-10-CM | POA: Diagnosis not present

## 2022-12-14 ENCOUNTER — Other Ambulatory Visit: Payer: Self-pay | Admitting: Internal Medicine

## 2023-01-07 ENCOUNTER — Encounter: Payer: Self-pay | Admitting: Internal Medicine

## 2023-01-11 NOTE — Progress Notes (Unsigned)
    Subjective:    Patient ID: Alicia Hunt, female    DOB: 31-Aug-1955, 67 y.o.   MRN: [id number]      HPI Alicia Hunt is here for No chief complaint on file.    Leg muscles spasms - she has been traveling a lot this summer.  She was getting muscle spasms - drinking pickle water or salt and lemon water.     One leg or the other -- usually more lower leg and foot.  Occurs at night or early morning.  No back pain.  It started the last couple of months.  She likely is not drinking enough water.    Medications and allergies reviewed with patient and updated if appropriate.  Current Outpatient Medications on File Prior to Visit  Medication Sig Dispense Refill   amLODipine-olmesartan (AZOR) 5-40 MG tablet TAKE 1 TABLET BY MOUTH DAILY 100 tablet 2   atorvastatin (LIPITOR) 10 MG tablet Take 1 tablet (10 mg total) by mouth daily. 90 tablet 3   Cholecalciferol (VITAMIN D3 PO) Take by mouth.     Cyanocobalamin (VITAMIN B-12 PO) Take by mouth.     escitalopram (LEXAPRO) 10 MG tablet Take 1 tablet (10 mg total) by mouth daily. 90 tablet 3   levothyroxine (SYNTHROID) 50 MCG tablet Take 1 tablet (50 mcg total) by mouth daily. 100 tablet 2   Multiple Vitamin (MULTIVITAMIN) capsule Take 1 capsule by mouth daily.     Semaglutide, 2 MG/DOSE, 8 MG/3ML SOPN Inject 2 mg as directed once a week. 9 mL 3   No current facility-administered medications on file prior to visit.    Review of Systems     Objective:   Vitals:   01/12/23 1445  BP: 120/76  Pulse: 67  Temp: 98.1 F (36.7 C)  SpO2: 97%   BP Readings from Last 3 Encounters:  01/12/23 120/76  08/28/22 128/76  04/22/22 121/73   Wt Readings from Last 3 Encounters:  01/12/23 186 lb 3.2 oz (84.5 kg)  08/28/22 188 lb (85.3 kg)  04/22/22 193 lb (87.5 kg)   Body mass index is 31.96 kg/m.    Physical Exam Constitutional:      General: She is not in acute distress.    Appearance: Normal appearance. She is not  ill-appearing.  HENT:     Head: Normocephalic and atraumatic.  Musculoskeletal:        General: No tenderness (b/l calves) or deformity.     Right lower leg: No edema.     Left lower leg: No edema.  Skin:    General: Skin is warm and dry.     Findings: No erythema or rash.  Neurological:     Mental Status: She is alert.     Sensory: No sensory deficit.            Assessment & Plan:    See Problem List for Assessment and Plan of chronic medical problems.

## 2023-01-12 ENCOUNTER — Encounter: Payer: Self-pay | Admitting: Internal Medicine

## 2023-01-12 ENCOUNTER — Ambulatory Visit (INDEPENDENT_AMBULATORY_CARE_PROVIDER_SITE_OTHER): Payer: Medicare Other | Admitting: Internal Medicine

## 2023-01-12 VITALS — BP 120/76 | HR 67 | Temp 98.1°F | Ht 64.0 in | Wt 186.2 lb

## 2023-01-12 DIAGNOSIS — G4762 Sleep related leg cramps: Secondary | ICD-10-CM

## 2023-01-12 DIAGNOSIS — I10 Essential (primary) hypertension: Secondary | ICD-10-CM

## 2023-01-12 LAB — BASIC METABOLIC PANEL
BUN: 17 mg/dL (ref 6–23)
CO2: 32 mEq/L (ref 19–32)
Calcium: 9.5 mg/dL (ref 8.4–10.5)
Chloride: 102 mEq/L (ref 96–112)
Creatinine, Ser: 0.91 mg/dL (ref 0.40–1.20)
GFR: 64.51 mL/min (ref >60.00)
Glucose, Bld: 97 mg/dL (ref 70–99)
Potassium: 4 mEq/L (ref 3.5–5.1)
Sodium: 140 mEq/L (ref 135–145)

## 2023-01-12 LAB — MAGNESIUM: Magnesium: 2 mg/dL (ref 1.5–2.5)

## 2023-01-12 NOTE — Patient Instructions (Addendum)
Blood work was ordered.   The lab is on the first floor.    Medications changes include :   none    Increase your water intake.   You can also stretch your legs more.    Return if symptoms worsen or fail to improve.    Leg Cramps Leg cramps occur when one or more muscles tighten and a person has no control over it (involuntary muscle contraction). Muscle cramps are most common in the calf muscles of the leg. They can occur during exercise or at rest. Leg cramps are painful, and they may last for a few seconds to a few minutes. Cramps may return several times before they finally stop. Usually, leg cramps are not caused by a serious medical problem. In many cases, the cause is not known. Some common causes include: Excessive physical effort (overexertion), such as during intense exercise. Doing the same motion over and over. Staying in a certain position for a long period of time. Improper preparation, form, or technique while doing a sport or an activity. Dehydration. Injury. Side effects of certain medicines. Abnormally low levels of minerals in your blood (electrolytes), especially potassium and calcium. This could result from: Pregnancy. Taking diuretic medicines. Follow these instructions at home: Eating and drinking Drink enough fluid to keep your urine pale yellow. Staying hydrated may help prevent cramps. Eat a healthy diet that includes plenty of nutrients to help your muscles function. A healthy diet includes fruits and vegetables, lean protein, whole grains, and low-fat or nonfat dairy products. Managing pain, stiffness, and swelling     Try massaging, stretching, and relaxing the affected muscle. Do this for several minutes at a time. If directed, put ice on areas that are sore or painful after a cramp. To do this: Put ice in a plastic bag. Place a towel between your skin and the bag. Leave the ice on for 20 minutes, 2-3 times a day. Remove the ice if  your skin turns bright red. This is very important. If you cannot feel pain, heat, or cold, you have a greater risk of damage to the area. If directed, apply heat to muscles that are tense or tight. Do this before you exercise, or as often as told by your health care provider. Use the heat source that your health care provider recommends, such as a moist heat pack or a heating pad. To do this: Place a towel between your skin and the heat source. Leave the heat on for 20-30 minutes. Remove the heat if your skin turns bright red. This is especially important if you are unable to feel pain, heat, or cold. You may have a greater risk of getting burned. Try taking hot showers or baths to help relax tight muscles. General instructions If you are having frequent leg cramps, avoid intense exercise for several days. Take over-the-counter and prescription medicines only as told by your health care provider. Keep all follow-up visits. This is important. Contact a health care provider if: Your leg cramps get more severe or more frequent, or they do not improve over time. Your foot becomes cold, numb, or blue. Summary Muscle cramps can develop in any muscle, but the most common place is in the calf muscles of the leg. Leg cramps are painful, and they may last for a few seconds to a few minutes. Usually, leg cramps are not caused by a serious medical problem. Often, the cause is not known. Stay hydrated, and take over-the-counter  and prescription medicines only as told by your health care provider. This information is not intended to replace advice given to you by your health care provider. Make sure you discuss any questions you have with your health care provider. Document Revised: 09/21/2019 Document Reviewed: 09/21/2019 Elsevier Patient Education  2024 [employer].

## 2023-01-12 NOTE — Assessment & Plan Note (Signed)
New Started a couple of months ago - b/l legs  - at night Likely related to dehydration -- increase water intake Possibly low K, Ca, Mg Check bmp, magnesium Can try yellow mustard, pickle juice, tonic water

## 2023-01-12 NOTE — Assessment & Plan Note (Signed)
Chronic Blood pressure well controlled Continue amlodipine-olmesartan 5-40 mg daily

## 2023-01-13 ENCOUNTER — Other Ambulatory Visit: Payer: Self-pay | Admitting: Internal Medicine

## 2023-01-13 DIAGNOSIS — Z1231 Encounter for screening mammogram for malignant neoplasm of breast: Secondary | ICD-10-CM

## 2023-01-14 DIAGNOSIS — L910 Hypertrophic scar: Secondary | ICD-10-CM | POA: Diagnosis not present

## 2023-01-14 DIAGNOSIS — Z789 Other specified health status: Secondary | ICD-10-CM | POA: Diagnosis not present

## 2023-01-14 DIAGNOSIS — L298 Other pruritus: Secondary | ICD-10-CM | POA: Diagnosis not present

## 2023-01-15 ENCOUNTER — Ambulatory Visit: Payer: Medicare Other | Admitting: Internal Medicine

## 2023-01-15 ENCOUNTER — Ambulatory Visit
Admission: RE | Admit: 2023-01-15 | Discharge: 2023-01-15 | Disposition: A | Discharge status: Home / Self Care | Payer: Medicare Other | Source: Ambulatory Visit

## 2023-01-15 DIAGNOSIS — Z1231 Encounter for screening mammogram for malignant neoplasm of breast: Secondary | ICD-10-CM

## 2023-01-27 DIAGNOSIS — H524 Presbyopia: Secondary | ICD-10-CM | POA: Diagnosis not present

## 2023-01-27 DIAGNOSIS — E119 Type 2 diabetes mellitus without complications: Secondary | ICD-10-CM | POA: Diagnosis not present

## 2023-01-27 DIAGNOSIS — H2513 Age-related nuclear cataract, bilateral: Secondary | ICD-10-CM | POA: Diagnosis not present

## 2023-01-27 LAB — HM DIABETES EYE EXAM

## 2023-02-20 DIAGNOSIS — H04121 Dry eye syndrome of right lacrimal gland: Secondary | ICD-10-CM | POA: Diagnosis not present

## 2023-02-20 DIAGNOSIS — H0100A Unspecified blepharitis right eye, upper and lower eyelids: Secondary | ICD-10-CM | POA: Diagnosis not present

## 2023-02-26 ENCOUNTER — Encounter: Payer: Self-pay | Admitting: Internal Medicine

## 2023-02-26 NOTE — Progress Notes (Signed)
Subjective:    Patient ID: Alicia Hunt, female    DOB: Oct 08, 1955, 67 y.o.   MRN: [id number]     HPI Alicia Hunt is here for follow up of her chronic medical problems.  Back to exercising back at the gym.   Increased stress.  Having more headaches.  Sometimes wakes up with headache, but more often it comes later in the day with anxiety.  Headaches are more on right side.  Takes ultracet and it helps.    Leg cramping is better-she is hydrating more and exercising more.   Medications and allergies reviewed with patient and updated if appropriate.  Current Outpatient Medications on File Prior to Visit  Medication Sig Dispense Refill   amLODipine-olmesartan (AZOR) 5-40 MG tablet TAKE 1 TABLET BY MOUTH DAILY 100 tablet 2   atorvastatin (LIPITOR) 10 MG tablet Take 1 tablet (10 mg total) by mouth daily. 90 tablet 3   Cholecalciferol (VITAMIN D3 PO) Take by mouth.     Cyanocobalamin (VITAMIN B-12 PO) Take by mouth.     escitalopram (LEXAPRO) 10 MG tablet Take 1 tablet (10 mg total) by mouth daily. 90 tablet 3   levothyroxine (SYNTHROID) 50 MCG tablet Take 1 tablet (50 mcg total) by mouth daily. 100 tablet 2   Multiple Vitamin (MULTIVITAMIN) capsule Take 1 capsule by mouth daily.     Semaglutide, 2 MG/DOSE, 8 MG/3ML SOPN Inject 2 mg as directed once a week. 9 mL 3   No current facility-administered medications on file prior to visit.     Review of Systems  Constitutional:  Negative for fever.  Respiratory:  Negative for cough, shortness of breath and wheezing.   Cardiovascular:  Negative for chest pain, palpitations and leg swelling.  Neurological:  Positive for dizziness (occ) and headaches (Right side of head only.  Some right-sided neck pain). Negative for light-headedness and numbness.       Objective:   Vitals:   02/27/23 1259  BP: 122/76  Pulse: 72  Temp: 98 F (36.7 C)  SpO2: 96%   BP Readings from Last 3 Encounters:  02/27/23 122/76  01/12/23  120/76  08/28/22 128/76   Wt Readings from Last 3 Encounters:  02/27/23 187 lb (84.8 kg)  01/12/23 186 lb 3.2 oz (84.5 kg)  08/28/22 188 lb (85.3 kg)   Body mass index is 32.1 kg/m.    Physical Exam Constitutional:      General: She is not in acute distress.    Appearance: Normal appearance.  HENT:     Head: Normocephalic and atraumatic.  Eyes:     Conjunctiva/sclera: Conjunctivae normal.  Cardiovascular:     Rate and Rhythm: Normal rate and regular rhythm.     Heart sounds: Normal heart sounds.  Pulmonary:     Effort: Pulmonary effort is normal. No respiratory distress.     Breath sounds: Normal breath sounds. No wheezing.  Musculoskeletal:     Cervical back: Neck supple.     Right lower leg: No edema.     Left lower leg: No edema.  Lymphadenopathy:     Cervical: No cervical adenopathy.  Skin:    General: Skin is warm and dry.     Findings: No rash.  Neurological:     Mental Status: She is alert. Mental status is at baseline.  Psychiatric:        Mood and Affect: Mood normal.        Behavior: Behavior normal.  Lab Results  Component Value Date   WBC 6.6 08/28/2022   HGB 15.1 (H) 08/28/2022   HCT 44.9 08/28/2022   PLT 259.0 08/28/2022   GLUCOSE 97 01/12/2023   CHOL 130 08/28/2022   TRIG 111.0 08/28/2022   HDL 50.50 08/28/2022   LDLDIRECT 79.0 11/12/2020   LDLCALC 57 08/28/2022   ALT 19 08/28/2022   AST 18 08/28/2022   NA 140 01/12/2023   K 4.0 01/12/2023   CL 102 01/12/2023   CREATININE 0.91 01/12/2023   BUN 17 01/12/2023   CO2 32 01/12/2023   TSH 2.82 08/28/2022   HGBA1C 5.5 08/28/2022     Assessment & Plan:    See Problem List for Assessment and Plan of chronic medical problems.

## 2023-02-26 NOTE — Patient Instructions (Addendum)
    Flu immunization administered today.      Blood work was ordered.   The lab is on the first floor.    Medications changes include :   None     Return in about 6 months (around 08/28/2023) for Physical Exam.

## 2023-02-27 ENCOUNTER — Ambulatory Visit: Payer: Medicare Other | Admitting: Internal Medicine

## 2023-02-27 VITALS — BP 122/76 | HR 72 | Temp 98.0°F | Ht 64.0 in | Wt 187.0 lb

## 2023-02-27 DIAGNOSIS — R7989 Other specified abnormal findings of blood chemistry: Secondary | ICD-10-CM

## 2023-02-27 DIAGNOSIS — I10 Essential (primary) hypertension: Secondary | ICD-10-CM

## 2023-02-27 DIAGNOSIS — G4733 Obstructive sleep apnea (adult) (pediatric): Secondary | ICD-10-CM

## 2023-02-27 DIAGNOSIS — E7849 Other hyperlipidemia: Secondary | ICD-10-CM | POA: Diagnosis not present

## 2023-02-27 DIAGNOSIS — G4762 Sleep related leg cramps: Secondary | ICD-10-CM | POA: Diagnosis not present

## 2023-02-27 DIAGNOSIS — E559 Vitamin D deficiency, unspecified: Secondary | ICD-10-CM | POA: Diagnosis not present

## 2023-02-27 DIAGNOSIS — G4486 Cervicogenic headache: Secondary | ICD-10-CM | POA: Insufficient documentation

## 2023-02-27 DIAGNOSIS — Z0000 Encounter for general adult medical examination without abnormal findings: Secondary | ICD-10-CM

## 2023-02-27 DIAGNOSIS — F419 Anxiety disorder, unspecified: Secondary | ICD-10-CM

## 2023-02-27 DIAGNOSIS — R7303 Prediabetes: Secondary | ICD-10-CM

## 2023-02-27 DIAGNOSIS — E039 Hypothyroidism, unspecified: Secondary | ICD-10-CM

## 2023-02-27 DIAGNOSIS — F3289 Other specified depressive episodes: Secondary | ICD-10-CM

## 2023-02-27 LAB — LIPID PANEL
Cholesterol: 127 mg/dL (ref 0–200)
HDL: 53.5 mg/dL (ref >39.00)
LDL Cholesterol: 59 mg/dL (ref 0–99)
NonHDL: 73.6
Total CHOL/HDL Ratio: 2
Triglycerides: 75 mg/dL (ref 0.0–149.0)
VLDL: 15 mg/dL (ref 0.0–40.0)

## 2023-02-27 LAB — CBC WITH DIFFERENTIAL/PLATELET
Basophils Absolute: 0.1 10*3/uL (ref 0.0–0.1)
Basophils Relative: 0.9 % (ref 0.0–3.0)
Eosinophils Absolute: 0.1 10*3/uL (ref 0.0–0.7)
Eosinophils Relative: 1.8 % (ref 0.0–5.0)
HCT: 45.1 % (ref 36.0–46.0)
Hemoglobin: 14.6 g/dL (ref 12.0–15.0)
Lymphocytes Relative: 21.7 % (ref 12.0–46.0)
Lymphs Abs: 1.6 10*3/uL (ref 0.7–4.0)
MCHC: 32.3 g/dL (ref 30.0–36.0)
MCV: 86.2 fL (ref 78.0–100.0)
Monocytes Absolute: 0.5 10*3/uL (ref 0.1–1.0)
Monocytes Relative: 6.6 % (ref 3.0–12.0)
Neutro Abs: 5.1 10*3/uL (ref 1.4–7.7)
Neutrophils Relative %: 69 % (ref 43.0–77.0)
Platelets: 281 10*3/uL (ref 150.0–400.0)
RBC: 5.24 Mil/uL — ABNORMAL HIGH (ref 3.87–5.11)
RDW: 13.8 % (ref 11.5–15.5)
WBC: 7.4 10*3/uL (ref 4.0–10.5)

## 2023-02-27 LAB — VITAMIN D 25 HYDROXY (VIT D DEFICIENCY, FRACTURES): VITD: 31.6 ng/mL (ref 30.00–100.00)

## 2023-02-27 LAB — COMPREHENSIVE METABOLIC PANEL
ALT: 20 U/L (ref 0–35)
AST: 20 U/L (ref 0–37)
Albumin: 4.1 g/dL (ref 3.5–5.2)
Alkaline Phosphatase: 90 U/L (ref 39–117)
BUN: 10 mg/dL (ref 6–23)
CO2: 31 meq/L (ref 19–32)
Calcium: 9.5 mg/dL (ref 8.4–10.5)
Chloride: 102 meq/L (ref 96–112)
Creatinine, Ser: 0.82 mg/dL (ref 0.40–1.20)
GFR: 73.03 mL/min (ref >60.00)
Glucose, Bld: 95 mg/dL (ref 70–99)
Potassium: 3.8 meq/L (ref 3.5–5.1)
Sodium: 140 meq/L (ref 135–145)
Total Bilirubin: 1.1 mg/dL (ref 0.2–1.2)
Total Protein: 7.1 g/dL (ref 6.0–8.3)

## 2023-02-27 LAB — VITAMIN B12: Vitamin B-12: 358 pg/mL (ref 211–911)

## 2023-02-27 LAB — MAGNESIUM: Magnesium: 1.9 mg/dL (ref 1.5–2.5)

## 2023-02-27 LAB — TSH: TSH: 2.69 u[IU]/mL (ref 0.35–5.50)

## 2023-02-27 LAB — HEMOGLOBIN A1C: Hgb A1c MFr Bld: 5.6 % (ref 4.6–6.5)

## 2023-02-27 NOTE — Assessment & Plan Note (Signed)
Chronic Blood pressure well controlled CBC, CMP Continue amlodipine-olmesartan 5-40 mg daily

## 2023-02-27 NOTE — Assessment & Plan Note (Signed)
Having increased headaches She thought it was related to stress but it is mostly on the right side of her head and the right neck Possibly cervicogenic in nature Had an x-ray of her cervical spine last year-some mild arthritis Discussed getting regular massages, using heat on the neck Okay to take Ultracet Deferred gabapentin at this time If symptoms worsen may need further evaluation of her neck

## 2023-02-27 NOTE — Assessment & Plan Note (Signed)
Experiencing increased anxiety She is going to the gym regularly and that does help-stress to make better. She is trying to block off time for herself so that she is not really is helping everyone else She is taking Lexapro 10 mg daily which is helping Discussed possibly increasing this dose, but we will hold off for now She will work harder on stress management and let me know if it is not helping

## 2023-02-27 NOTE — Assessment & Plan Note (Signed)
Chronic Taking vitamin D daily Check vitamin D level  

## 2023-02-27 NOTE — Assessment & Plan Note (Signed)
Chronic Check a1c Low sugar / carb diet Stressed regular exercise Continue Ozempic 2 mg weekly

## 2023-02-27 NOTE — Assessment & Plan Note (Signed)
Subacute Possibly related to dehydration She has increased her fluids Will check magnesium level, CMP

## 2023-02-27 NOTE — Assessment & Plan Note (Signed)
Chronic Regular exercise and healthy diet encouraged Check lipid panel, CMP, TSH Continue atorvastatin 10 mg daily 

## 2023-02-27 NOTE — Assessment & Plan Note (Signed)
Chronic Taking vitamin B12 daily-continue Check B12 level

## 2023-02-27 NOTE — Assessment & Plan Note (Signed)
Chronic  Clinically euthyroid Check tsh and will titrate med dose if needed Currently taking levothyroxine 50 mcg daily 

## 2023-02-27 NOTE — Assessment & Plan Note (Signed)
Chronic Uses CPAP nightly

## 2023-02-27 NOTE — Assessment & Plan Note (Signed)
Chronic Controlled, Stable Continue Lexapro 10 mg daily 

## 2023-03-12 ENCOUNTER — Ambulatory Visit: Payer: Medicare Other | Admitting: Adult Health

## 2023-03-12 ENCOUNTER — Encounter: Payer: Self-pay | Admitting: Adult Health

## 2023-03-12 VITALS — BP 131/77 | HR 66 | Ht 64.0 in | Wt 187.0 lb

## 2023-03-12 DIAGNOSIS — G4733 Obstructive sleep apnea (adult) (pediatric): Secondary | ICD-10-CM | POA: Diagnosis not present

## 2023-03-12 NOTE — Progress Notes (Signed)
[facility] [address] [place]. [place] [address] [phone]       OFFICE FOLLOW UP NOTE  [name] Date of Birth:  05-14-1956 Medical Record Number:  [id number]    Primary neurologist: Dr. Vickey Huger Reason for visit: CPAP follow-up    SUBJECTIVE:   CHIEF COMPLAINT:  Chief Complaint  Patient presents with   Follow-up    Rm 3, here alone Pt is here for yearly follow up on OSA with CPAP. Pt states she is doing well. States she is using her CPAP everyday.  No concerns.     Brief HPI:   Alicia Hunt is a 67 y.o. female who was initially evaluated by Dr. Vickey Huger on 01/07/2022 with prior diagnosis of sleep apnea in 2017 on CPAP therapy but machine broke and was in need of new machine. ESS 13/24. FSS 43/63.  HST 01/09/2022 confirmed presence of severe OSA with total AHI 42.7/h.  AutoPap set up 02/27/2022.   At prior visit, compliance report showed excellent usage and optimal residual AHI.  Noted improvement of daytime energy levels and sleep quality. ESS 2/24.     Interval history:  CPAP compliance report as below showing excellent usage and optimal residual AHI.  Reports doing well with CPAP, unable to sleep without it. Can have occasional leaks from mask when laying on side but will resolve after repositioning mask, this is not overly bothersome.  Continued benefit in regards to daytime fatigue and sleep quality. Up to date on supplies, followed by [facility].  No questions or concerns today.            ROS:   14 system review of systems performed and negative with exception of those listed in HPI  PMH:  Past Medical History:  Diagnosis Date   Anxiety    CPAP (continuous positive airway pressure) dependence    Depression    Diverticulitis    Hyperlipidemia    Hypertension    Hypothyroidism    Sleep apnea    CPAP    PSH:  Past Surgical History:  Procedure Laterality Date   ABDOMINAL HYSTERECTOMY      BREAST EXCISIONAL BIOPSY Left 20+ years   pt states dr didnt remove lump   CHOLECYSTECTOMY     COLONOSCOPY     12 years ago-Diverticulitis bleed in [place]    Social History:  Social History   Socioeconomic History   Marital status: Married    Spouse name: Environmental health practitioner   Number of children: 2   Years of education: Not on file   Highest education level: Not on file  Occupational History   Occupation: housewife  Tobacco Use   Smoking status: Never   Smokeless tobacco: Never  Vaping Use   Vaping status: Never Used  Substance and Sexual Activity   Alcohol use: Yes    Alcohol/week: 1.0 standard drink of alcohol    Types: 1 Glasses of wine per week    Comment: wine occ-social   Drug use: Never   Sexual activity: Not Currently  Other Topics Concern   Not on file  Social History Narrative   Lives at home with husband   R handed   Caffeine: 2 C of coffee a day   Social Determinants of Health   Financial Resource Strain: Low Risk  (01/12/2023)   Overall Financial Resource Strain (CARDIA)    Difficulty of Paying Living Expenses: Not hard at all  Food Insecurity: No Food Insecurity (01/12/2023)   Hunger Vital  Sign    Worried About Programme researcher, broadcasting/film/video in the Last Year: Never true    Ran Out of Food in the Last Year: Never true  Transportation Needs: No Transportation Needs (01/12/2023)   PRAPARE - Administrator, Civil Service (Medical): No    Lack of Transportation (Non-Medical): No  Physical Activity: Unknown (01/12/2023)   Exercise Vital Sign    Days of Exercise per Week: 3 days    Minutes of Exercise per Session: Patient declined  Stress: No Stress Concern Present (01/12/2023)   [employer] of Occupational Health - Occupational Stress Questionnaire    Feeling of Stress : Only a little  Social Connections: Unknown (01/12/2023)   Social Connection and Isolation Panel [NHANES]    Frequency of Communication with Friends and Family: Patient declined    Frequency of  Social Gatherings with Friends and Family: Patient declined    Attends Religious Services: Patient declined    Database administrator or Organizations: Patient declined    Attends Banker Meetings: 1 to 4 times per year    Marital Status: Married  Catering manager Violence: Not At Risk (03/20/2022)   Humiliation, Afraid, Rape, and Kick questionnaire    Fear of Current or Ex-Partner: No    Emotionally Abused: No    Physically Abused: No    Sexually Abused: No    Family History:  Family History  Problem Relation Age of Onset   Stroke Mother 10       cause of death   Hypertension Mother    Hypertension Father    Breast cancer Neg Hx    Colon cancer Neg Hx    Colon polyps Neg Hx    Esophageal cancer Neg Hx    Stomach cancer Neg Hx    Rectal cancer Neg Hx     Medications:   Current Outpatient Medications on File Prior to Visit  Medication Sig Dispense Refill   amLODipine-olmesartan (AZOR) 5-40 MG tablet TAKE 1 TABLET BY MOUTH DAILY 100 tablet 2   atorvastatin (LIPITOR) 10 MG tablet Take 1 tablet (10 mg total) by mouth daily. 90 tablet 3   Cholecalciferol (VITAMIN D3 PO) Take by mouth.     Cyanocobalamin (VITAMIN B-12 PO) Take by mouth.     escitalopram (LEXAPRO) 10 MG tablet Take 1 tablet (10 mg total) by mouth daily. 90 tablet 3   levothyroxine (SYNTHROID) 50 MCG tablet Take 1 tablet (50 mcg total) by mouth daily. 100 tablet 2   Multiple Vitamin (MULTIVITAMIN) capsule Take 1 capsule by mouth daily.     Semaglutide, 2 MG/DOSE, 8 MG/3ML SOPN Inject 2 mg as directed once a week. 9 mL 3   No current facility-administered medications on file prior to visit.    Allergies:  No Known Allergies    OBJECTIVE:  Physical Exam  Vitals:   03/12/23 1026  BP: 131/77  Pulse: 66  Weight: 187 lb (84.8 kg)  Height: 5\' 4"  (1.626 m)   Body mass index is 32.1 kg/m. No results found.  General: well developed, well nourished, very pleasant middle-aged female, seated, in  no evident distress Head: head normocephalic and atraumatic.   Neck: supple with no carotid or supraclavicular bruits Cardiovascular: regular rate and rhythm, no murmurs Musculoskeletal: no deformity Skin:  no rash/petichiae Vascular:  Normal pulses all extremities   Neurologic Exam Mental Status: Awake and fully alert. Oriented to place and time. Recent and remote memory intact. Attention span, concentration and fund  of knowledge appropriate. Mood and affect appropriate.  Cranial Nerves: Pupils equal, briskly reactive to light. Extraocular movements full without nystagmus. Visual fields full to confrontation. Hearing intact. Facial sensation intact. Face, tongue, palate moves normally and symmetrically.  Motor: Normal bulk and tone. Normal strength in all tested extremity muscles Gait and Station: Arises from chair without difficulty. Stance is normal. Gait demonstrates normal stride length and balance without use of AD.  Reflexes: 1+ and symmetric. Toes downgoing.         ASSESSMENT/PLAN: Alicia Hunt is a 67 y.o. year old female with longstanding history of severe sleep apnea reconfirmed per HST 01/09/2022 on CPAP    OSA on CPAP : Compliance report shows satisfactory usage with optimal residual AHI.  Continue current pressure settings. Discussed continued nightly usage with ensuring greater than 4 hours nightly for optimal benefit and per insurance purposes.  Continue to follow with DME company for any needed supplies or CPAP related concerns     Follow up in 1 year or call earlier if needed   CC:  PCP: Pincus Sanes, MD    I spent 15 minutes of face-to-face and non-face-to-face time with patient.  This included previsit chart review, lab review, study review, order entry, electronic health record documentation, patient education regarding sleep apnea with review and discussion of compliance report and answered all other questions to patient's  satisfaction  Ihor Austin, [facility]  [facility] [address] [place], [place] [address]  Phone [phone] Fax [phone] Note: This document was prepared with digital dictation and possible smart phrase technology. Any transcriptional errors that result from this process are unintentional.

## 2023-03-12 NOTE — Patient Instructions (Addendum)
Your Plan:  Continue nightly use of CPAP for adequate sleep apnea management  Continue to follow with DME [facility] for any needed supplies or CPAP related concerns     Follow up in 1 year or call earlier if needed      Thank you for coming to see [place] at [facility]. I hope we have been able to provide you high quality care today.  You may receive a patient satisfaction survey over the next few weeks. We would appreciate your feedback and comments so that we may continue to improve ourselves and the health of our patients.

## 2023-03-25 ENCOUNTER — Other Ambulatory Visit: Payer: Self-pay | Admitting: Internal Medicine

## 2023-04-10 ENCOUNTER — Encounter: Payer: Self-pay | Admitting: Internal Medicine

## 2023-04-10 MED ORDER — TRAMADOL-ACETAMINOPHEN 37.5-325 MG PO TABS: 1.0000 | ORAL_TABLET | Freq: Three times a day (TID) | ORAL | 1 refills | Status: DC | PRN

## 2023-04-28 ENCOUNTER — Ambulatory Visit: Payer: Medicare Other | Admitting: Adult Health

## 2023-05-18 ENCOUNTER — Ambulatory Visit (INDEPENDENT_AMBULATORY_CARE_PROVIDER_SITE_OTHER): Payer: Medicare Other

## 2023-05-18 VITALS — Ht 64.0 in | Wt 187.0 lb

## 2023-05-18 DIAGNOSIS — Z0000 Encounter for general adult medical examination without abnormal findings: Secondary | ICD-10-CM

## 2023-05-18 NOTE — Progress Notes (Addendum)
Subjective:   Alicia Hunt is a 67 y.o. female who presents for Medicare Annual (Subsequent) preventive examination.  Visit Complete: Virtual I connected with  Alicia Hunt on 05/18/23 by a audio enabled telemedicine application and verified that I am speaking with the correct person using two identifiers.  Patient Location: Home  Provider Location: Office/Clinic  I discussed the limitations of evaluation and management by telemedicine. The patient expressed understanding and agreed to proceed.  Vital Signs: Because this visit was a virtual/telehealth visit, some criteria may be missing or patient reported. Any vitals not documented were not able to be obtained and vitals that have been documented are patient reported.  Patient Medicare AWV questionnaire was completed by the patient on 05/14/2023; I have confirmed that all information answered by patient is correct and no changes since this date.  Cardiac Risk Factors include: advanced age (>54men, >67 women);hypertension;Other (see comment);dyslipidemia;obesity (BMI >30kg/m2), Risk factor comments: OSA, Osteopenia     Objective:    Today's Vitals   05/18/23 1345  Weight: 187 lb (84.8 kg)  Height: 5\' 4"  (1.626 m)   Body mass index is 32.1 kg/m.     05/18/2023    1:48 PM 03/20/2022    3:41 PM 09/24/2021   10:43 AM 04/19/2021   11:12 AM 01/24/2020   10:20 AM  Advanced Directives  Does Patient Have a Medical Advance Directive? Yes Yes Yes No No  Type of Estate agent of [place];Living will Healthcare Power of [place];Living will Healthcare Power of [place];Living will    Does patient want to make changes to medical advance directive?  No - Patient declined No - Patient declined    Copy of Healthcare Power of Attorney in Chart? No - copy requested No - copy requested No - copy requested    Would patient like information on creating a medical advance directive?     No - Patient declined     Current Medications (verified) Outpatient Encounter Medications as of 05/18/2023  Medication Sig   amLODipine-olmesartan (AZOR) 5-40 MG tablet TAKE 1 TABLET BY MOUTH DAILY   atorvastatin (LIPITOR) 10 MG tablet Take 1 tablet (10 mg total) by mouth daily.   Cholecalciferol (VITAMIN D3 PO) Take by mouth.   Cyanocobalamin (VITAMIN B-12 PO) Take by mouth.   escitalopram (LEXAPRO) 10 MG tablet Take 1 tablet (10 mg total) by mouth daily.   levothyroxine (SYNTHROID) 50 MCG tablet Take 1 tablet (50 mcg total) by mouth daily.   Multiple Vitamin (MULTIVITAMIN) capsule Take 1 capsule by mouth daily.   OZEMPIC, 2 MG/DOSE, 8 MG/3ML SOPN INJECT SUBCUTANEOUSLY 2 MG EVERY WEEK AS DIRECTED   traMADol-acetaminophen (ULTRACET) 37.5-325 MG tablet Take 1-2 tablets by mouth every 8 (eight) hours as needed.   No facility-administered encounter medications on file as of 05/18/2023.    Allergies (verified) Patient has no known allergies.   History: Past Medical History:  Diagnosis Date   Anxiety    CPAP (continuous positive airway pressure) dependence    Depression    Diverticulitis    Hyperlipidemia    Hypertension    Hypothyroidism    Sleep apnea    CPAP   Past Surgical History:  Procedure Laterality Date   ABDOMINAL HYSTERECTOMY     BREAST EXCISIONAL BIOPSY Left 20+ years   pt states dr didnt remove lump   CHOLECYSTECTOMY     COLONOSCOPY     12 years ago-Diverticulitis bleed in [place]   Family History  Problem Relation Age  of Onset   Stroke Mother 42       cause of death   Hypertension Mother    Hypertension Father    Breast cancer Neg Hx    Colon cancer Neg Hx    Colon polyps Neg Hx    Esophageal cancer Neg Hx    Stomach cancer Neg Hx    Rectal cancer Neg Hx    Social History   Socioeconomic History   Marital status: Married    Spouse name: Environmental health practitioner   Number of children: 2   Years of education: Not on file   Highest education level: Not on file  Occupational History    Occupation: housewife  Tobacco Use   Smoking status: Never   Smokeless tobacco: Never  Vaping Use   Vaping status: Never Used  Substance and Sexual Activity   Alcohol use: Yes    Alcohol/week: 1.0 standard drink of alcohol    Types: 1 Glasses of wine per week    Comment: wine occ-social   Drug use: Never   Sexual activity: Not Currently  Other Topics Concern   Not on file  Social History Narrative   Lives at home with husband   R handed   Caffeine: 2 C of coffee a day   Social Drivers of Corporate investment banker Strain: Low Risk  (05/18/2023)   Overall Financial Resource Strain (CARDIA)    Difficulty of Paying Living Expenses: Not hard at all  Food Insecurity: No Food Insecurity (05/18/2023)   Hunger Vital Sign    Worried About Running Out of Food in the Last Year: Never true    Ran Out of Food in the Last Year: Never true  Transportation Needs: No Transportation Needs (05/18/2023)   PRAPARE - Administrator, Civil Service (Medical): No    Lack of Transportation (Non-Medical): No  Physical Activity: Sufficiently Active (05/18/2023)   Exercise Vital Sign    Days of Exercise per Week: 4 days    Minutes of Exercise per Session: 40 min  Stress: No Stress Concern Present (05/18/2023)   [employer] of Occupational Health - Occupational Stress Questionnaire    Feeling of Stress : Not at all  Social Connections: Moderately Integrated (05/18/2023)   Social Connection and Isolation Panel [NHANES]    Frequency of Communication with Friends and Family: More than three times a week    Frequency of Social Gatherings with Friends and Family: Three times a week    Attends Religious Services: 1 to 4 times per year    Active Member of Clubs or Organizations: No    Attends Banker Meetings: Never    Marital Status: Married    Tobacco Counseling Counseling given: Not Answered   Clinical Intake:  Pre-visit preparation completed: Yes  Pain :  No/denies pain     BMI - recorded: 32.1 Nutritional Status: BMI > 30  Obese Nutritional Risks: None Diabetes: No  How often do you need to have someone help you when you read instructions, pamphlets, or other written materials from your doctor or pharmacy?: 1 - Never  Interpreter Needed?: No  Information entered by :: Laureano Hetzer, RMA   Activities of Daily Living    05/14/2023    8:44 AM  In your present state of health, do you have any difficulty performing the following activities:  Hearing? 0  Vision? 0  Difficulty concentrating or making decisions? 0  Walking or climbing stairs? 0  Dressing or bathing? 0  Doing errands, shopping? 0  Preparing Food and eating ? N  Using the Toilet? N  In the past six months, have you accidently leaked urine? N  Do you have problems with loss of bowel control? N  Managing your Medications? N  Managing your Finances? N  Housekeeping or managing your Housekeeping? N    Patient Care Team: Pincus Sanes, MD as PCP - General (Internal Medicine) Sinda Du, MD as Consulting Physician (Ophthalmology)  Indicate any recent Medical Services you may have received from other than [facility] providers in the past year (date may be approximate).     Assessment:   This is a routine wellness examination for Alicia Hunt.  Hearing/Vision screen Hearing Screening - Comments:: Denies hearing difficulties   Vision Screening - Comments:: Wears eyeglasses    Goals Addressed             This Visit's Progress    Patient Stated   On track    Continue to lose weight with goal of decreasing medication.      Depression Screen    05/18/2023    1:51 PM 01/12/2023    2:48 PM 08/28/2022   11:12 AM 03/20/2022    3:38 PM 08/26/2021   10:54 AM 11/12/2020    3:47 PM 03/06/2020    4:41 PM  PHQ 2/9 Scores  PHQ - 2 Score 0 0 0 0 0 0 0  PHQ- 9 Score 0 1 1        Fall Risk    05/14/2023    8:44 AM 01/12/2023    2:48 PM 08/28/2022   11:12 AM  03/20/2022    3:42 PM 08/26/2021   10:54 AM  Fall Risk   Falls in the past year? 0 0 0 0 0  Number falls in past yr: 0 0 0 0   Injury with Fall? 0 0 0 0   Risk for fall due to : No Fall Risks No Fall Risks No Fall Risks No Fall Risks   Follow up Falls evaluation completed;Falls prevention discussed  Falls evaluation completed Falls evaluation completed     MEDICARE RISK AT HOME: Medicare Risk at Home Any stairs in or around the home?: (Patient-Rptd) Yes If so, are there any without handrails?: (Patient-Rptd) No Home free of loose throw rugs in walkways, pet beds, electrical cords, etc?: (Patient-Rptd) Yes Adequate lighting in your home to reduce risk of falls?: (Patient-Rptd) Yes Life alert?: (Patient-Rptd) No Use of a cane, walker or w/c?: (Patient-Rptd) No Grab bars in the bathroom?: (Patient-Rptd) No Shower chair or bench in shower?: (Patient-Rptd) No Elevated toilet seat or a handicapped toilet?: (Patient-Rptd) No  TIMED UP AND GO:  Was the test performed?  No    Cognitive Function:        05/18/2023    1:48 PM 03/20/2022    3:42 PM  6CIT Screen  What Year? 0 points 0 points  What month? 0 points 0 points  What time? 0 points 0 points  Count back from 20 0 points 0 points  Months in reverse 0 points 0 points  Repeat phrase 0 points 0 points  Total Score 0 points 0 points    Immunizations Immunization History  Administered Date(s) Administered   Fluad Quad(high Dose 65+) 02/24/2019, 03/06/2020, 02/26/2022   PFIZER(Purple Top)SARS-COV-2 Vaccination 06/01/2019, 06/20/2019   PNEUMOCOCCAL CONJUGATE-20 02/26/2022    TDAP status: Due, Education has been provided regarding the importance of this vaccine. Advised may receive this vaccine at local pharmacy  or Health Dept. Aware to provide a copy of the vaccination record if obtained from local pharmacy or Health Dept. Verbalized acceptance and understanding.  Flu Vaccine status: Up to date  Pneumococcal vaccine status:  Up to date  Covid-19 vaccine status: Declined, Education has been provided regarding the importance of this vaccine but patient still declined. Advised may receive this vaccine at local pharmacy or Health Dept.or vaccine clinic. Aware to provide a copy of the vaccination record if obtained from local pharmacy or Health Dept. Verbalized acceptance and understanding.  Qualifies for Shingles Vaccine? Yes   Zostavax completed No   Shingrix Completed?: No.    Education has been provided regarding the importance of this vaccine. Patient has been advised to call insurance company to determine out of pocket expense if they have not yet received this vaccine. Advised may also receive vaccine at local pharmacy or Health Dept. Verbalized acceptance and understanding.  Screening Tests Health Maintenance  Topic Date Due   COVID-19 Vaccine (3 - 2024-25 season) 06/03/2023 (Originally 01/18/2023)   Zoster Vaccines- Shingrix (1 of 2) 08/16/2023 (Originally 11/01/2003)   INFLUENZA VACCINE  08/17/2023 (Originally 12/18/2022)   DTaP/Tdap/Td (1 - Tdap) 09/16/2023 (Originally 10[birth year]31/1972)   DEXA SCAN  10/16/2023 (Originally 03/16/2023)   Hepatitis C Screening  09/13/2050 (Originally [birth year]01/1971)   Medicare Annual Wellness (AWV)  05/17/2024   MAMMOGRAM  01/14/2025   Pneumonia Vaccine 21+ Years old  Completed   HPV VACCINES  Aged Out   Colonoscopy  Discontinued    Health Maintenance  There are no preventive care reminders to display for this patient.   Colorectal cancer screening: Type of screening: Colonoscopy. Completed 02/17/2022. Repeat every 10 years  Mammogram status: Completed 01/15/2023. Repeat every year  Bone Density status: Completed 03/15/2021. Results reflect: Bone density results: OSTEOPENIA. Repeat every 2 years.  Lung Cancer Screening: (Low Dose CT Chest recommended if Age 58-80 years, 20 pack-year currently smoking OR have quit w/in 15years.) does not qualify.   Lung Cancer Screening Referral:  N/A  Additional Screening:  Hepatitis C Screening: does qualify;   Vision Screening: Recommended annual ophthalmology exams for early detection of glaucoma and other disorders of the eye. Is the patient up to date with their annual eye exam?  Yes  Who is the provider or what is the name of the office in which the patient attends annual eye exams? Dr. Cathey Endow If pt is not established with a provider, would they like to be referred to a provider to establish care? No .   Dental Screening: Recommended annual dental exams for proper oral hygiene   Community Resource Referral / Chronic Care Management: CRR required this visit?  No   CCM required this visit?  No     Plan:     I have personally reviewed and noted the following in the patient's chart:   Medical and social history Use of alcohol, tobacco or illicit drugs  Current medications and supplements including opioid prescriptions. Patient is not currently taking opioid prescriptions. Functional ability and status Nutritional status Physical activity Advanced directives List of other physicians Hospitalizations, surgeries, and ER visits in previous 12 months Vitals Screenings to include cognitive, depression, and falls Referrals and appointments  In addition, I have reviewed and discussed with patient certain preventive protocols, quality metrics, and best practice recommendations. A written personalized care plan for preventive services as well as general preventive health recommendations were provided to patient.     Wyvonne Lenz, CMA   05/18/2023  After Visit Summary: (MyChart) Due to this being a telephonic visit, the after visit summary with patients personalized plan was offered to patient via MyChart   Nurse Notes: Patient declines vaccines.  She is aware that she is due for the Tdap.  Patient is due for a DEXA, however, she would like to discuss with Dr. Lawerance Bach during her next office visit.  Patient had no other  concerns to address today.

## 2023-05-18 NOTE — Patient Instructions (Signed)
Alicia Hunt , Thank you for taking time to come for your Medicare Wellness Visit. I appreciate your ongoing commitment to your health goals. Please review the following plan we discussed and let me know if I can assist you in the future.   Referrals/Orders/Follow-Ups/Clinician Recommendations: You are due for a Tetanus vaccine.  Remember to discuss a bone density screening with Alicia Hunt during your next office visit.   This is a list of the screening recommended for you and due dates:  Health Maintenance  Topic Date Due   COVID-19 Vaccine (3 - 2024-25 season) 06/03/2023*   Zoster (Shingles) Vaccine (1 of 2) 08/16/2023*   Flu Shot  08/17/2023*   DTaP/Tdap/Td vaccine (1 - Tdap) 09/16/2023*   DEXA scan (bone density measurement)  10/16/2023*   Hepatitis C Screening  09/13/2050*   Medicare Annual Wellness Visit  05/17/2024   Mammogram  01/14/2025   Pneumonia Vaccine  Completed   HPV Vaccine  Aged Out   Colon Cancer Screening  Discontinued  *Topic was postponed. The date shown is not the original due date.    Advanced directives: (Copy Requested) Please bring a copy of your health care power of attorney and living will to the office to be added to your chart at your convenience.  Next Medicare Annual Wellness Visit scheduled for next year: Yes

## 2023-07-03 ENCOUNTER — Other Ambulatory Visit: Payer: Self-pay | Admitting: Internal Medicine

## 2023-07-28 ENCOUNTER — Telehealth: Payer: Self-pay

## 2023-07-28 DIAGNOSIS — Z9989 Dependence on other enabling machines and devices: Secondary | ICD-10-CM

## 2023-07-28 DIAGNOSIS — G4733 Obstructive sleep apnea (adult) (pediatric): Secondary | ICD-10-CM

## 2023-07-28 NOTE — Telephone Encounter (Signed)
 Patient called and lvm on sleep lab phone requesting call back about ordering CPAP supplies, stated she had the number for visit notes and prescriptions to be sent to just needed someone to call her back please.

## 2023-07-29 NOTE — Addendum Note (Signed)
 Addended by: Judi Cong on: 07/29/2023 09:57 AM   Modules accepted: Orders

## 2023-07-29 NOTE — Telephone Encounter (Signed)
 Called the pt back to get clarification on what is needed. Alicia Hunt is advising her they need an updated order as well as ov notes etc. I advised that I will touch base with [facility]. I advised that we still send everything through [facility] and they get it sent to [facility] for the pt. Currently the pt only needs straps at this time but advised I would just have someone contact her. She was appreciative for the call back.

## 2023-08-07 DIAGNOSIS — H2513 Age-related nuclear cataract, bilateral: Secondary | ICD-10-CM | POA: Diagnosis not present

## 2023-08-07 DIAGNOSIS — H52203 Unspecified astigmatism, bilateral: Secondary | ICD-10-CM | POA: Diagnosis not present

## 2023-08-19 ENCOUNTER — Telehealth: Payer: Self-pay | Admitting: *Deleted

## 2023-08-27 NOTE — Patient Instructions (Addendum)
      Blood work was ordered.       Medications changes include :   start vitamin D3 2000 units daily     Return in about 6 months (around 02/27/2024) for Physical Exam.

## 2023-08-27 NOTE — Progress Notes (Unsigned)
 Subjective:    Patient ID: Alicia Hunt, female    DOB: [birth year], 68 y.o.   MRN: [id number]     HPI Chantrice is here for follow up of her chronic medical problems.  Ozempic is helping but not as much-she feels she is not losing more weight.  She has not exercised the past few months but is back to exercising.  She feels better.  She also has noticed that she needs to probably work a little bit harder with her diet.  Her weight has stayed the same.  Overall she feels well.  Medications and allergies reviewed with patient and updated if appropriate.  Current Outpatient Medications on File Prior to Visit  Medication Sig Dispense Refill   amLODipine-olmesartan (AZOR) 5-40 MG tablet TAKE 1 TABLET BY MOUTH DAILY 100 tablet 2   atorvastatin (LIPITOR) 10 MG tablet Take 1 tablet (10 mg total) by mouth daily. 90 tablet 3   Cholecalciferol (VITAMIN D3 PO) Take by mouth.     Cyanocobalamin (VITAMIN B-12 PO) Take by mouth.     escitalopram (LEXAPRO) 10 MG tablet Take 1 tablet (10 mg total) by mouth daily. 90 tablet 3   levothyroxine (SYNTHROID) 50 MCG tablet TAKE 1 TABLET BY MOUTH DAILY 100 tablet 2   Multiple Vitamin (MULTIVITAMIN) capsule Take 1 capsule by mouth daily.     OZEMPIC, 2 MG/DOSE, 8 MG/3ML SOPN INJECT SUBCUTANEOUSLY 2 MG EVERY WEEK AS DIRECTED 9 mL 3   traMADol-acetaminophen (ULTRACET) 37.5-325 MG tablet Take 1-2 tablets by mouth every 8 (eight) hours as needed. 90 tablet 1   No current facility-administered medications on file prior to visit.     Review of Systems  Constitutional:  Negative for fever.  Respiratory:  Negative for cough, shortness of breath and wheezing.   Cardiovascular:  Negative for chest pain, palpitations and leg swelling.  Musculoskeletal:  Positive for arthralgias.  Neurological:  Negative for light-headedness and headaches.       Objective:   Vitals:   08/28/23 1309  BP: 128/80  Pulse: 98  Temp: 98 F (36.7 C)  SpO2: 95%    BP Readings from Last 3 Encounters:  08/28/23 128/80  03/12/23 131/77  02/27/23 122/76   Wt Readings from Last 3 Encounters:  08/28/23 187 lb (84.8 kg)  05/18/23 187 lb (84.8 kg)  03/12/23 187 lb (84.8 kg)   Body mass index is 32.1 kg/m.    Physical Exam Constitutional:      General: She is not in acute distress.    Appearance: Normal appearance.  HENT:     Head: Normocephalic and atraumatic.  Eyes:     Conjunctiva/sclera: Conjunctivae normal.  Cardiovascular:     Rate and Rhythm: Normal rate and regular rhythm.     Heart sounds: Normal heart sounds.  Pulmonary:     Effort: Pulmonary effort is normal. No respiratory distress.     Breath sounds: Normal breath sounds. No wheezing.  Musculoskeletal:     Cervical back: Neck supple.     Right lower leg: No edema.     Left lower leg: No edema.  Lymphadenopathy:     Cervical: No cervical adenopathy.  Skin:    General: Skin is warm and dry.     Findings: No rash.  Neurological:     Mental Status: She is alert. Mental status is at baseline.  Psychiatric:        Mood and Affect: Mood normal.  Behavior: Behavior normal.        Lab Results  Component Value Date   WBC 7.4 02/27/2023   HGB 14.6 02/27/2023   HCT 45.1 02/27/2023   PLT 281.0 02/27/2023   GLUCOSE 95 02/27/2023   CHOL 127 02/27/2023   TRIG 75.0 02/27/2023   HDL 53.50 02/27/2023   LDLDIRECT 79.0 11/12/2020   LDLCALC 59 02/27/2023   ALT 20 02/27/2023   AST 20 02/27/2023   NA 140 02/27/2023   K 3.8 02/27/2023   CL 102 02/27/2023   CREATININE 0.82 02/27/2023   BUN 10 02/27/2023   CO2 31 02/27/2023   TSH 2.69 02/27/2023   HGBA1C 5.6 02/27/2023     Assessment & Plan:    See Problem List for Assessment and Plan of chronic medical problems.

## 2023-08-28 ENCOUNTER — Encounter: Payer: Self-pay | Admitting: Internal Medicine

## 2023-08-28 ENCOUNTER — Ambulatory Visit: Payer: Medicare Other | Admitting: Internal Medicine

## 2023-08-28 VITALS — BP 128/80 | HR 98 | Temp 98.0°F | Ht 64.0 in | Wt 187.0 lb

## 2023-08-28 DIAGNOSIS — M8588 Other specified disorders of bone density and structure, other site: Secondary | ICD-10-CM

## 2023-08-28 DIAGNOSIS — R7303 Prediabetes: Secondary | ICD-10-CM

## 2023-08-28 DIAGNOSIS — R7989 Other specified abnormal findings of blood chemistry: Secondary | ICD-10-CM | POA: Diagnosis not present

## 2023-08-28 DIAGNOSIS — G4733 Obstructive sleep apnea (adult) (pediatric): Secondary | ICD-10-CM

## 2023-08-28 DIAGNOSIS — E7849 Other hyperlipidemia: Secondary | ICD-10-CM

## 2023-08-28 DIAGNOSIS — E559 Vitamin D deficiency, unspecified: Secondary | ICD-10-CM

## 2023-08-28 DIAGNOSIS — I10 Essential (primary) hypertension: Secondary | ICD-10-CM | POA: Diagnosis not present

## 2023-08-28 DIAGNOSIS — E039 Hypothyroidism, unspecified: Secondary | ICD-10-CM | POA: Diagnosis not present

## 2023-08-28 DIAGNOSIS — F3289 Other specified depressive episodes: Secondary | ICD-10-CM | POA: Diagnosis not present

## 2023-08-28 DIAGNOSIS — F419 Anxiety disorder, unspecified: Secondary | ICD-10-CM

## 2023-08-28 DIAGNOSIS — G4486 Cervicogenic headache: Secondary | ICD-10-CM | POA: Diagnosis not present

## 2023-08-28 LAB — CBC WITH DIFFERENTIAL/PLATELET
Basophils Absolute: 0 10*3/uL (ref 0.0–0.1)
Basophils Relative: 0.5 % (ref 0.0–3.0)
Eosinophils Absolute: 0.1 10*3/uL (ref 0.0–0.7)
Eosinophils Relative: 2.3 % (ref 0.0–5.0)
HCT: 41.9 % (ref 36.0–46.0)
Hemoglobin: 14 g/dL (ref 12.0–15.0)
Lymphocytes Relative: 30.6 % (ref 12.0–46.0)
Lymphs Abs: 1.9 10*3/uL (ref 0.7–4.0)
MCHC: 33.4 g/dL (ref 30.0–36.0)
MCV: 85.4 fl (ref 78.0–100.0)
Monocytes Absolute: 0.5 10*3/uL (ref 0.1–1.0)
Monocytes Relative: 7.5 % (ref 3.0–12.0)
Neutro Abs: 3.7 10*3/uL (ref 1.4–7.7)
Neutrophils Relative %: 59.1 % (ref 43.0–77.0)
Platelets: 256 10*3/uL (ref 150.0–400.0)
RBC: 4.91 Mil/uL (ref 3.87–5.11)
RDW: 13.6 % (ref 11.5–15.5)
WBC: 6.3 10*3/uL (ref 4.0–10.5)

## 2023-08-28 LAB — VITAMIN B12: Vitamin B-12: 917 pg/mL — ABNORMAL HIGH (ref 211–911)

## 2023-08-28 LAB — COMPREHENSIVE METABOLIC PANEL WITH GFR
ALT: 18 U/L (ref 0–35)
AST: 22 U/L (ref 0–37)
Albumin: 4.2 g/dL (ref 3.5–5.2)
Alkaline Phosphatase: 82 U/L (ref 39–117)
BUN: 11 mg/dL (ref 6–23)
CO2: 31 meq/L (ref 19–32)
Calcium: 9.4 mg/dL (ref 8.4–10.5)
Chloride: 103 meq/L (ref 96–112)
Creatinine, Ser: 0.75 mg/dL (ref 0.40–1.20)
GFR: 81 mL/min (ref >60.00)
Glucose, Bld: 90 mg/dL (ref 70–99)
Potassium: 4.1 meq/L (ref 3.5–5.1)
Sodium: 141 meq/L (ref 135–145)
Total Bilirubin: 1 mg/dL (ref 0.2–1.2)
Total Protein: 7.2 g/dL (ref 6.0–8.3)

## 2023-08-28 LAB — LIPID PANEL
Cholesterol: 121 mg/dL (ref 0–200)
HDL: 47.1 mg/dL (ref >39.00)
LDL Cholesterol: 44 mg/dL (ref 0–99)
NonHDL: 73.52
Total CHOL/HDL Ratio: 3
Triglycerides: 149 mg/dL (ref 0.0–149.0)
VLDL: 29.8 mg/dL (ref 0.0–40.0)

## 2023-08-28 LAB — TSH: TSH: 2.43 u[IU]/mL (ref 0.35–5.50)

## 2023-08-28 LAB — HEMOGLOBIN A1C: Hgb A1c MFr Bld: 5.6 % (ref 4.6–6.5)

## 2023-08-28 LAB — VITAMIN D 25 HYDROXY (VIT D DEFICIENCY, FRACTURES): VITD: 25.01 ng/mL — ABNORMAL LOW (ref 30.00–100.00)

## 2023-08-28 NOTE — Assessment & Plan Note (Signed)
Chronic Taking vitamin B12 daily-continue Check B12 level

## 2023-08-28 NOTE — Assessment & Plan Note (Signed)
 Chronic Uses CPAP nightly BMI 32.1 She has prediabetes and would benefit from weight loss Could consider switching Ozempic to Zepbound to see if this provided more weight loss

## 2023-08-28 NOTE — Assessment & Plan Note (Signed)
 Chronic Lab Results  Component Value Date   HGBA1C 5.6 08/28/2023   Check a1c Low sugar / carb diet Stressed regular exercise Continue Ozempic 2 mg weekly

## 2023-08-28 NOTE — Assessment & Plan Note (Signed)
Chronic DEXA up-to-date Continue calcium, vitamin D supplementation Continue regular exercise

## 2023-08-28 NOTE — Assessment & Plan Note (Signed)
Chronic Regular exercise and healthy diet encouraged Check lipid panel, CMP, TSH Continue atorvastatin 10 mg daily 

## 2023-08-28 NOTE — Assessment & Plan Note (Signed)
 Chronic Controlled, Stable Continue Lexapro 10 mg daily

## 2023-08-28 NOTE — Assessment & Plan Note (Signed)
 Chronic Taking vitamin D daily Check vitamin D level

## 2023-08-28 NOTE — Assessment & Plan Note (Signed)
Chronic °Controlled, stable °Continue Lexapro 10 mg daily °

## 2023-08-28 NOTE — Assessment & Plan Note (Signed)
Chronic Blood pressure well controlled CBC, CMP Continue amlodipine-olmesartan 5-40 mg daily

## 2023-08-28 NOTE — Assessment & Plan Note (Signed)
 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

## 2023-08-28 NOTE — Assessment & Plan Note (Signed)
 Chronic  Clinically euthyroid Check tsh and will titrate med dose if needed Currently taking levothyroxine 50 mcg daily

## 2023-08-29 ENCOUNTER — Encounter: Payer: Self-pay | Admitting: Internal Medicine

## 2023-09-14 ENCOUNTER — Other Ambulatory Visit: Payer: Self-pay | Admitting: Internal Medicine

## 2023-09-21 ENCOUNTER — Other Ambulatory Visit: Payer: Self-pay | Admitting: Internal Medicine

## 2023-10-01 ENCOUNTER — Other Ambulatory Visit: Payer: Self-pay | Admitting: Internal Medicine

## 2023-10-07 DIAGNOSIS — H25811 Combined forms of age-related cataract, right eye: Secondary | ICD-10-CM | POA: Diagnosis not present

## 2023-10-07 DIAGNOSIS — H2511 Age-related nuclear cataract, right eye: Secondary | ICD-10-CM | POA: Diagnosis not present

## 2023-11-12 DIAGNOSIS — Z961 Presence of intraocular lens: Secondary | ICD-10-CM | POA: Diagnosis not present

## 2023-11-12 DIAGNOSIS — H524 Presbyopia: Secondary | ICD-10-CM | POA: Diagnosis not present

## 2023-11-26 DIAGNOSIS — H43811 Vitreous degeneration, right eye: Secondary | ICD-10-CM | POA: Diagnosis not present

## 2023-11-26 DIAGNOSIS — H04123 Dry eye syndrome of bilateral lacrimal glands: Secondary | ICD-10-CM | POA: Diagnosis not present

## 2023-11-29 ENCOUNTER — Other Ambulatory Visit: Payer: Self-pay | Admitting: Internal Medicine

## 2023-12-09 ENCOUNTER — Other Ambulatory Visit: Payer: Self-pay | Admitting: Family

## 2023-12-23 ENCOUNTER — Ambulatory Visit: Admitting: Internal Medicine

## 2024-01-05 DIAGNOSIS — H1131 Conjunctival hemorrhage, right eye: Secondary | ICD-10-CM | POA: Diagnosis not present

## 2024-01-17 ENCOUNTER — Other Ambulatory Visit: Payer: Self-pay | Admitting: Internal Medicine

## 2024-02-15 NOTE — Progress Notes (Unsigned)
 SABRA

## 2024-02-16 ENCOUNTER — Encounter: Payer: Self-pay | Admitting: Adult Health

## 2024-02-16 ENCOUNTER — Ambulatory Visit: Payer: Self-pay

## 2024-02-16 ENCOUNTER — Telehealth: Admitting: Adult Health

## 2024-02-16 DIAGNOSIS — G4733 Obstructive sleep apnea (adult) (pediatric): Secondary | ICD-10-CM

## 2024-02-16 NOTE — Telephone Encounter (Signed)
 FYI Only or Action Required?: FYI only for provider.  Patient was last seen in primary care on 08/28/2023 by Geofm Glade PARAS, MD.  Called Nurse Triage reporting Finger Swelling.  Symptoms began a week ago.  Interventions attempted: Rest, hydration, or home remedies.  Symptoms are: stable.  Triage Disposition: See PCP When Office is Open (Within 3 Days)  Patient/caregiver understands and will follow disposition?: Yes Reason for Disposition  [1] MODERATE pain (e.g., interferes with normal activities) AND [2] present > 3 days  Answer Assessment - Initial Assessment Questions Using ice, putting in brace to limit movement. Patient already has PCP appointment scheduled for this issue.   1. ONSET: When did the pain start?      A week ago  2. LOCATION and RADIATION: Where is the pain located?  (e.g., fingertip, around nail, joint, entire      Right thumb whole thumb swollen  3. SEVERITY: How bad is the pain? What does it keep you from doing?   (Scale 1-10; or mild, moderate, severe)     Throbbing pain   4. APPEARANCE: What does the finger look like? (e.g., redness, swelling, bruising, pallor)     Whole thumb is swollen, and inside under the knuckle is red  6. CAUSE: What do you think is causing the pain?     Maybe hurt it using clippers in the garden, clippers hit knuckle really hard  7. AGGRAVATING FACTORS: What makes the pain worse? (e.g., using computer)     Moving it  Protocols used: Finger Pain-A-AH  Copied from CRM [id number]. Topic: Clinical - Red Word Triage >> Feb 16, 2024  2:51 PM Alfonso ORN wrote: Red Word that prompted transfer to Nurse Triage: thumb has been swollen for past 5 days she is seeing pcp tomorrow (also wants bloodwork that was ordered done same day)

## 2024-02-16 NOTE — Progress Notes (Unsigned)
    Subjective:    Patient ID: Alicia Hunt, female    DOB: 04/07/1956, 68 y.o.   MRN: [id number]      HPI Alicia Hunt is here for No chief complaint on file.   Right thumb pain and swelling.  Started 1 week ago and she describes the pain as a throbbing pain.  The area around the knuckle is red.  She thought maybe she injured it while she was gardening-she was trimming bushes and the clipper hit the knuckle very hard.  Any movement increases the pain.    Medications and allergies reviewed with patient and updated if appropriate.  Current Outpatient Medications on File Prior to Visit  Medication Sig Dispense Refill   amLODipine -olmesartan  (AZOR ) 5-40 MG tablet TAKE 1 TABLET BY MOUTH DAILY 100 tablet 2   atorvastatin  (LIPITOR) 10 MG tablet TAKE 1 TABLET BY MOUTH DAILY 100 tablet 2   Cholecalciferol (VITAMIN D3 PO) Take by mouth.     Cyanocobalamin  (VITAMIN B-12 PO) Take by mouth.     escitalopram  (LEXAPRO ) 10 MG tablet TAKE 1 TABLET BY MOUTH DAILY 90 tablet 3   levothyroxine  (SYNTHROID ) 50 MCG tablet TAKE 1 TABLET BY MOUTH DAILY 100 tablet 2   Multiple Vitamin (MULTIVITAMIN) capsule Take 1 capsule by mouth daily.     OZEMPIC , 2 MG/DOSE, 8 MG/3ML SOPN INJECT SUBCUTANEOUSLY 2 MG EVERY WEEK AS DIRECTED 9 mL 3   traMADol -acetaminophen  (ULTRACET ) 37.5-325 MG tablet TAKE 1 TO 2 TABLETS BY MOUTH  EVERY 8 HOURS AS NEEDED 90 tablet 0   No current facility-administered medications on file prior to visit.    Review of Systems     Objective:  There were no vitals filed for this visit. BP Readings from Last 3 Encounters:  08/28/23 128/80  03/12/23 131/77  02/27/23 122/76   Wt Readings from Last 3 Encounters:  08/28/23 187 lb (84.8 kg)  05/18/23 187 lb (84.8 kg)  03/12/23 187 lb (84.8 kg)   There is no height or weight on file to calculate BMI.    Physical Exam         Assessment & Plan:    See Problem List for Assessment and Plan of chronic medical problems.

## 2024-02-17 ENCOUNTER — Telehealth: Payer: Self-pay | Admitting: Internal Medicine

## 2024-02-17 ENCOUNTER — Ambulatory Visit: Admitting: Internal Medicine

## 2024-02-17 ENCOUNTER — Encounter: Payer: Self-pay | Admitting: Internal Medicine

## 2024-02-17 ENCOUNTER — Ambulatory Visit: Payer: Self-pay | Admitting: Internal Medicine

## 2024-02-17 ENCOUNTER — Ambulatory Visit (INDEPENDENT_AMBULATORY_CARE_PROVIDER_SITE_OTHER)

## 2024-02-17 VITALS — BP 130/80 | HR 78 | Temp 98.0°F | Ht 64.0 in | Wt 186.0 lb

## 2024-02-17 DIAGNOSIS — G4733 Obstructive sleep apnea (adult) (pediatric): Secondary | ICD-10-CM | POA: Diagnosis not present

## 2024-02-17 DIAGNOSIS — M79644 Pain in right finger(s): Secondary | ICD-10-CM

## 2024-02-17 DIAGNOSIS — R7989 Other specified abnormal findings of blood chemistry: Secondary | ICD-10-CM

## 2024-02-17 DIAGNOSIS — I10 Essential (primary) hypertension: Secondary | ICD-10-CM

## 2024-02-17 DIAGNOSIS — Z23 Encounter for immunization: Secondary | ICD-10-CM | POA: Diagnosis not present

## 2024-02-17 DIAGNOSIS — E039 Hypothyroidism, unspecified: Secondary | ICD-10-CM

## 2024-02-17 DIAGNOSIS — R7303 Prediabetes: Secondary | ICD-10-CM

## 2024-02-17 DIAGNOSIS — M19031 Primary osteoarthritis, right wrist: Secondary | ICD-10-CM | POA: Diagnosis not present

## 2024-02-17 DIAGNOSIS — M79646 Pain in unspecified finger(s): Secondary | ICD-10-CM

## 2024-02-17 DIAGNOSIS — E559 Vitamin D deficiency, unspecified: Secondary | ICD-10-CM

## 2024-02-17 DIAGNOSIS — E66811 Obesity, class 1: Secondary | ICD-10-CM | POA: Insufficient documentation

## 2024-02-17 DIAGNOSIS — F419 Anxiety disorder, unspecified: Secondary | ICD-10-CM

## 2024-02-17 LAB — COMPREHENSIVE METABOLIC PANEL WITH GFR
ALT: 22 U/L (ref 0–35)
AST: 25 U/L (ref 0–37)
Albumin: 4 g/dL (ref 3.5–5.2)
Alkaline Phosphatase: 76 U/L (ref 39–117)
BUN: 14 mg/dL (ref 6–23)
CO2: 28 meq/L (ref 19–32)
Calcium: 9.1 mg/dL (ref 8.4–10.5)
Chloride: 104 meq/L (ref 96–112)
Creatinine, Ser: 0.76 mg/dL (ref 0.40–1.20)
GFR: 79.46 mL/min (ref >60.00)
Glucose, Bld: 100 mg/dL — ABNORMAL HIGH (ref 70–99)
Potassium: 3.8 meq/L (ref 3.5–5.1)
Sodium: 139 meq/L (ref 135–145)
Total Bilirubin: 1 mg/dL (ref 0.2–1.2)
Total Protein: 7.1 g/dL (ref 6.0–8.3)

## 2024-02-17 LAB — CBC
HCT: 41.1 % (ref 36.0–46.0)
Hemoglobin: 13.9 g/dL (ref 12.0–15.0)
MCHC: 33.7 g/dL (ref 30.0–36.0)
MCV: 82.9 fl (ref 78.0–100.0)
Platelets: 241 K/uL (ref 150.0–400.0)
RBC: 4.96 Mil/uL (ref 3.87–5.11)
RDW: 13.9 % (ref 11.5–15.5)
WBC: 6.2 K/uL (ref 4.0–10.5)

## 2024-02-17 LAB — LIPID PANEL
Cholesterol: 124 mg/dL (ref 0–200)
HDL: 44 mg/dL (ref >39.00)
LDL Cholesterol: 54 mg/dL (ref 0–99)
NonHDL: 80.46
Total CHOL/HDL Ratio: 3
Triglycerides: 132 mg/dL (ref 0.0–149.0)
VLDL: 26.4 mg/dL (ref 0.0–40.0)

## 2024-02-17 LAB — VITAMIN B12: Vitamin B-12: 777 pg/mL (ref 211–911)

## 2024-02-17 LAB — TSH: TSH: 4.4 u[IU]/mL (ref 0.35–5.50)

## 2024-02-17 LAB — VITAMIN D 25 HYDROXY (VIT D DEFICIENCY, FRACTURES): VITD: 32.83 ng/mL (ref 30.00–100.00)

## 2024-02-17 LAB — HEMOGLOBIN A1C: Hgb A1c MFr Bld: 5.6 % (ref 4.6–6.5)

## 2024-02-17 MED ORDER — TRAMADOL-ACETAMINOPHEN 37.5-325 MG PO TABS: 1.0000 | ORAL_TABLET | Freq: Three times a day (TID) | ORAL | 0 refills | Status: AC | PRN

## 2024-02-17 MED ORDER — TIRZEPATIDE-WEIGHT MANAGEMENT 5 MG/0.5ML ~~LOC~~ SOLN: 5.0000 mg | SUBCUTANEOUS | 1 refills | Status: DC

## 2024-02-17 MED ORDER — TRAMADOL-ACETAMINOPHEN 37.5-325 MG PO TABS: 1.0000 | ORAL_TABLET | Freq: Three times a day (TID) | ORAL | 0 refills | Status: DC | PRN

## 2024-02-17 NOTE — Telephone Encounter (Signed)
 Sent to [facility] more than 1 local pharmacy [facility] that is correct

## 2024-02-17 NOTE — Assessment & Plan Note (Signed)
Chronic °Controlled, stable °Continue Lexapro 10 mg daily °

## 2024-02-17 NOTE — Patient Instructions (Addendum)
     Flu immunization administered today.     Blood work was ordered.    An xray was ordered    Medications changes include :   None     Return in about 6 months (around 08/17/2024) for Physical Exam - cancel 10/7 appt.

## 2024-02-17 NOTE — Telephone Encounter (Signed)
 PT requesting to change from ozempic  to zepbound, [facility] [address] please call or send message on mychart.

## 2024-02-17 NOTE — Assessment & Plan Note (Signed)
Chronic Taking vitamin B12 daily-continue Check B12 level

## 2024-02-17 NOTE — Assessment & Plan Note (Addendum)
 Chronic Uses CPAP nightly BMI 31.93 She has prediabetes and would benefit from weight loss for both her prediabetes and sleep apnea Ozempic  has not been effective-I think she would benefit from Zepbound-Will start Zepbound weekly

## 2024-02-17 NOTE — Assessment & Plan Note (Signed)
Chronic Blood pressure well controlled CBC, CMP Continue amlodipine-olmesartan 5-40 mg daily

## 2024-02-17 NOTE — Telephone Encounter (Signed)
 Did she want to do the zepbound 2.5 or 5 mg?   Also this will need prior auth so it may take a few days.

## 2024-02-17 NOTE — Assessment & Plan Note (Signed)
 Chronic Lab Results  Component Value Date   HGBA1C 5.6 08/28/2023   Check a1c Low sugar / carb diet Stressed regular exercise Ozempic  has not been effective-Will change to Zepbound given OSA see if that helps more which I believe it will

## 2024-02-17 NOTE — Assessment & Plan Note (Signed)
 Chronic  Clinically euthyroid Check tsh and will titrate med dose if needed Currently taking levothyroxine  50 mcg daily

## 2024-02-17 NOTE — Assessment & Plan Note (Signed)
 Acute Just over a week ago she injured her thumb gardening Thumb is swollen, very tender, slightly numb at the tip-has not seen much improvement X-ray today to rule out fracture Likely tendon or ligament injury-likely needs to see sports medicine or hand orthopedics Continue Ultracet , Aleve as needed, ice and wearing brace

## 2024-02-17 NOTE — Assessment & Plan Note (Signed)
 Chronic Taking vitamin D daily Check vitamin D level

## 2024-02-18 ENCOUNTER — Telehealth: Payer: Self-pay

## 2024-02-18 ENCOUNTER — Other Ambulatory Visit (HOSPITAL_COMMUNITY): Payer: Self-pay

## 2024-02-18 NOTE — Telephone Encounter (Signed)
 Pharmacy Patient Advocate Encounter   Received notification from Pt Calls Messages that prior authorization for Zepbound 5mg /0.27ml is required/requested.   Insurance verification completed.   The patient is insured through [facility].   Per test claim: PA required; PA started via CoverMyMeds. KEY [id number] . Waiting for clinical questions to populate.

## 2024-02-18 NOTE — Addendum Note (Signed)
 Addended by: CLAUDENE TOBIAS PARAS on: 02/18/2024 02:39 PM   Modules accepted: Orders

## 2024-02-18 NOTE — Telephone Encounter (Signed)
 Clinical questions answered and PA submitted.

## 2024-02-19 ENCOUNTER — Other Ambulatory Visit: Payer: Self-pay | Admitting: Internal Medicine

## 2024-02-19 ENCOUNTER — Other Ambulatory Visit (HOSPITAL_COMMUNITY): Payer: Self-pay

## 2024-02-19 DIAGNOSIS — H26491 Other secondary cataract, right eye: Secondary | ICD-10-CM | POA: Diagnosis not present

## 2024-02-19 DIAGNOSIS — H2512 Age-related nuclear cataract, left eye: Secondary | ICD-10-CM | POA: Diagnosis not present

## 2024-02-19 NOTE — Telephone Encounter (Signed)
 Pharmacy Patient Advocate Encounter  Received notification from [employer] that Prior Authorization for Zepbound 5mg /0.19ml pens has been APPROVED from 02/19/24 to 05/18/25. Ran test claim, Copay is $0. This test claim was processed through [facility]- copay amounts may vary at other pharmacies due to pharmacy/plan contracts, or as the patient moves through the different stages of their insurance plan.   PA #/Case ID/Reference #: [id number]  Could you please send in an order for the pens to the pharmacy? Per the chart it was for the vials.

## 2024-02-22 ENCOUNTER — Other Ambulatory Visit (HOSPITAL_COMMUNITY): Payer: Self-pay

## 2024-02-22 MED ORDER — TIRZEPATIDE-WEIGHT MANAGEMENT 5 MG/0.5ML ~~LOC~~ SOAJ
5.0000 mg | SUBCUTANEOUS | 0 refills | Status: AC
  Filled 2024-02-22: qty 2, 28d supply, fill #0
  Filled 2024-02-23: qty 2, 28d supply, fill #2
  Filled 2024-02-23: qty 2, 28d supply, fill #1
  Filled 2024-02-23: qty 2, 28d supply, fill #3

## 2024-02-23 ENCOUNTER — Ambulatory Visit: Admitting: Internal Medicine

## 2024-02-23 ENCOUNTER — Other Ambulatory Visit: Payer: Self-pay | Admitting: Internal Medicine

## 2024-02-23 ENCOUNTER — Other Ambulatory Visit (HOSPITAL_COMMUNITY): Payer: Self-pay

## 2024-02-23 MED ORDER — AMLODIPINE-OLMESARTAN 5-40 MG PO TABS
1.0000 | ORAL_TABLET | Freq: Every day | ORAL | 2 refills | Status: AC
  Filled 2024-02-23 – 2024-06-18 (×3): qty 100, 100d supply, fill #0

## 2024-02-23 MED ORDER — ATORVASTATIN CALCIUM 10 MG PO TABS
10.0000 mg | ORAL_TABLET | Freq: Every day | ORAL | 2 refills | Status: AC
  Filled 2024-02-23 – 2024-06-18 (×3): qty 100, 100d supply, fill #0

## 2024-02-23 MED ORDER — ESCITALOPRAM OXALATE 10 MG PO TABS
10.0000 mg | ORAL_TABLET | Freq: Every day | ORAL | 3 refills | Status: AC
  Filled 2024-02-23 – 2024-06-18 (×3): qty 90, 90d supply, fill #0

## 2024-02-23 NOTE — Telephone Encounter (Signed)
 Copied from CRM [id number]. Topic: Clinical - Medication Refill >> Feb 23, 2024 12:49 PM Lauren C wrote: Medication: escitalopram  (LEXAPRO ) 10 MG tablet atorvastatin  (LIPITOR) 10 MG tablet amLODipine -olmesartan  (AZOR ) 5-40 MG tablet  Has the patient contacted their pharmacy? No  Patient is going out of town soon and usually gets her meds through [facility]. Requesting meds sent to [facility] off [address] as soon as possible before vacation.   This is the patient's preferred pharmacy:  [facility] [address], [address] [place] [address] Phone: [phone] Fax: [phone]  Is this the correct pharmacy for this prescription? Yes If no, delete pharmacy and type the correct one.   Has the prescription been filled recently? Yes  Is the patient out of the medication? No  Has the patient been seen for an appointment in the last year OR does the patient have an upcoming appointment? Yes  Can we respond through MyChart? Yes  Agent: Please be advised that Rx refills may take up to 3 business days. We ask that you follow-up with your pharmacy.

## 2024-02-25 ENCOUNTER — Ambulatory Visit: Admitting: Orthopedic Surgery

## 2024-02-25 ENCOUNTER — Other Ambulatory Visit: Payer: Self-pay | Admitting: Internal Medicine

## 2024-02-25 ENCOUNTER — Other Ambulatory Visit (HOSPITAL_COMMUNITY): Payer: Self-pay

## 2024-02-25 DIAGNOSIS — M65311 Trigger thumb, right thumb: Secondary | ICD-10-CM

## 2024-02-25 DIAGNOSIS — M65319 Trigger thumb, unspecified thumb: Secondary | ICD-10-CM | POA: Diagnosis not present

## 2024-02-25 MED ORDER — BETAMETHASONE SOD PHOS & ACET 6 (3-3) MG/ML IJ SUSP
6.0000 mg | INTRAMUSCULAR | Status: AC | PRN
  Administered 2024-02-25: 6 mg via INTRA_ARTICULAR

## 2024-02-25 MED ORDER — LIDOCAINE HCL 1 % IJ SOLN
1.0000 mL | INTRAMUSCULAR | Status: AC | PRN
  Administered 2024-02-25: 1 mL

## 2024-02-25 NOTE — Progress Notes (Signed)
 Alicia Hunt [facility] - 68 y.o. female MRN [id number]  Date of birth: 12/11/1955  Office Visit Note: Visit Date: 02/25/2024 PCP: Geofm Glade PARAS, MD Referred by: Geofm Glade PARAS, MD  Subjective: No chief complaint on file.  HPI: Alicia Hunt is a pleasant 68 y.o. female who presents today for right thumb pain. 2 weeks ago she was using a garden tool and when she went to clip the tool sprung back and pulled on her thumb.  She states that she was doing some heavy activity with the thumb for multiple weeks leading up to that with ongoing soreness as well.  Prior x-rays of the right thumb are negative.  Pertinent ROS were reviewed with the patient and found to be negative unless otherwise specified above in HPI.   Visit Reason: right thumb stenosing tenosynovitis Duration of symptoms: 2 weeks Hand dominance: right Occupation: retired Diabetic: No Smoking: No Heart/Lung History: none Blood Thinners: none  Prior Testing/EMG: 02/17/24 Injections (Date): none Treatments: none Prior Surgery: none    Assessment & Plan: Visit Diagnoses:  1. Stenosing tenosynovitis of thumb     Plan: Extensive discussion was had with the patient today regarding her right thumb pain.  She is demonstrating signs and symptoms consistent with stenosing tenosynovitis of the right thumb.  We discussed the etiology and pathophysiology of stenosing tenosynovitis.  We discussed conservative versus surgical treatment modalities.  From a conservative standpoint, we discussed activity modification, splinting, therapy and injections.  From a surgical standpoint, we discussed the possibility for trigger digit release as well as all risk and benefits associated.  Given that she has not trialed conservative treatments, patient is appropriate candidate for cortisone injection to the right thumb A1 pulley for symptom relief.  Risks and benefit of the cortisone injection were discussed in detail, patient agreed to  proceed.  Injection was provided today without issue, patient is going abroad soon, she can return as needed moving forward  Follow-up: No follow-ups on file.   Meds & Orders: No orders of the defined types were placed in this encounter.   Orders Placed This Encounter  Procedures   Hand/UE Inj     Procedures: Hand/UE Inj: R thumb A1 for trigger finger on 02/25/2024 7:23 PM Indications: pain Details: 25 G needle, volar approach Medications: 1 mL lidocaine 1 %; 6 mg betamethasone acetate-betamethasone sodium phosphate 6 (3-3) MG/ML Outcome: tolerated well, no immediate complications Procedure, treatment alternatives, risks and benefits explained, specific risks discussed. Consent was given by the patient. Patient was prepped and draped in the usual sterile fashion.          Clinical History: No specialty comments available.  She reports that she has never smoked. She has never used smokeless tobacco.  Recent Labs    02/27/23 1334 08/28/23 1412 02/17/24 0952  HGBA1C 5.6 5.6 5.6    Objective:   Vital Signs: There were no vitals taken for this visit.  Physical Exam  Gen: Well-appearing, in no acute distress; non-toxic CV: Regular Rate. Well-perfused. Warm.  Resp: Breathing unlabored on room air; no wheezing. Psych: Fluid speech in conversation; appropriate affect; normal thought process  Ortho Exam Right thumb: - Palpable nodule at the A1 pulley of the thumb, associated tenderness - Notable pain elicited with IP flexion at the right thumb with palpation at the A1 region - Sensation intact distally, hand remains warm well-perfused    Imaging: No results found.  Past Medical/Family/Surgical/Social History: Medications & Allergies reviewed per EMR, new medications updated.  Patient Active Problem List   Diagnosis Date Noted   Thumb pain, right 02/17/2024   Obesity (BMI 30.0-34.9) 02/17/2024   Cervicogenic headache 02/27/2023   Anxiety 02/27/2023   Nocturnal leg  cramps 01/12/2023   Abdominal bloating 08/28/2022   Muscle strain of right upper back 08/26/2021   Arthralgia 05/06/2021   PND (post-nasal drip) 05/06/2021   Chest tightness 05/06/2021   Dizziness 04/25/2021   Osteopenia 03/16/2021   GERD (gastroesophageal reflux disease) 02/20/2021   Lump of skin 02/20/2021   Mild concussion 01/05/2020   Closed fracture of distal clavicle 01/05/2020   Right shoulder pain 01/05/2020   Head injury 01/05/2020   Constipation 11/15/2019   Right lower quadrant abdominal pain 11/15/2019   Lumbar radiculopathy 11/01/2019   Chest pain 09/13/2019   Burning sensation of feet 09/13/2019   OSA on CPAP 09/13/2019   Hyperuricemia 11/22/2018   Prediabetes 01/07/2018   Hypertension 01/06/2018   Hypothyroidism 01/06/2018   Vitamin D  deficiency 01/06/2018   Low vitamin B12 level 01/06/2018   Depression 01/06/2018   Other hyperlipidemia 01/06/2018   H/O Diverticulitis of colon with bleeding 01/06/2018   Past Medical History:  Diagnosis Date   Anxiety    CPAP (continuous positive airway pressure) dependence    Depression    Diverticulitis    Hyperlipidemia    Hypertension    Hypothyroidism    Sleep apnea    CPAP   Family History  Problem Relation Age of Onset   Stroke Mother 63       cause of death   Hypertension Mother    Hypertension Father    Breast cancer Neg Hx    Colon cancer Neg Hx    Colon polyps Neg Hx    Esophageal cancer Neg Hx    Stomach cancer Neg Hx    Rectal cancer Neg Hx    Past Surgical History:  Procedure Laterality Date   ABDOMINAL HYSTERECTOMY     BREAST EXCISIONAL BIOPSY Left 20+ years   pt states dr didnt remove lump   CHOLECYSTECTOMY     COLONOSCOPY     12 years ago-Diverticulitis bleed in [place]   Social History   Occupational History   Occupation: housewife  Tobacco Use   Smoking status: Never   Smokeless tobacco: Never  Vaping Use   Vaping status: Never Used  Substance and Sexual Activity   Alcohol use:  Yes    Alcohol/week: 1.0 standard drink of alcohol    Types: 1 Glasses of wine per week    Comment: wine occ-social   Drug use: Never   Sexual activity: Not Currently    Alicia Hunt, M.D. [facility], Hand Surgery

## 2024-03-01 ENCOUNTER — Ambulatory Visit: Admitting: Internal Medicine

## 2024-03-17 ENCOUNTER — Ambulatory Visit: Payer: Medicare Other | Admitting: Adult Health

## 2024-03-21 ENCOUNTER — Encounter: Payer: Self-pay | Admitting: Radiology

## 2024-04-11 ENCOUNTER — Other Ambulatory Visit: Payer: Self-pay | Admitting: Internal Medicine

## 2024-05-16 ENCOUNTER — Encounter: Payer: Self-pay | Admitting: *Deleted

## 2024-05-16 NOTE — Progress Notes (Signed)
 Laural Eiland                                          MRN: [id number]   05/16/2024   The [facility] Quality Team Specialist reviewed this patient medical record for the purposes of chart review for care gap closure. The following were reviewed: chart review for care gap closure-kidney health evaluation for diabetes:eGFR  and uACR.    [facility] Quality Team

## 2024-06-18 ENCOUNTER — Other Ambulatory Visit: Payer: Self-pay | Admitting: Internal Medicine

## 2024-06-18 ENCOUNTER — Other Ambulatory Visit (HOSPITAL_COMMUNITY): Payer: Self-pay

## 2024-06-20 ENCOUNTER — Other Ambulatory Visit (HOSPITAL_COMMUNITY): Payer: Self-pay

## 2024-06-20 ENCOUNTER — Other Ambulatory Visit: Payer: Self-pay

## 2024-06-20 MED ORDER — TIRZEPATIDE 7.5 MG/0.5ML ~~LOC~~ SOAJ
7.5000 mg | SUBCUTANEOUS | 0 refills | Status: AC
  Filled 2024-06-20: qty 2, 28d supply, fill #0

## 2024-06-22 ENCOUNTER — Other Ambulatory Visit (HOSPITAL_COMMUNITY): Payer: Self-pay

## 2024-07-05 ENCOUNTER — Ambulatory Visit

## 2024-08-17 ENCOUNTER — Encounter: Admitting: Internal Medicine

## 2025-02-16 ENCOUNTER — Telehealth: Admitting: Adult Health
